# Patient Record
Sex: Male | Born: 1940 | Race: White | Hispanic: No | Marital: Married | Smoking: Never smoker
Health system: Southern US, Community
[De-identification: ages and names within clinical notes are randomized; demographics above are authoritative.]

---

## 2015-09-05 DIAGNOSIS — Z0000 Encounter for general adult medical examination without abnormal findings: Secondary | ICD-10-CM | POA: Diagnosis not present

## 2015-09-05 DIAGNOSIS — E785 Hyperlipidemia, unspecified: Secondary | ICD-10-CM | POA: Diagnosis not present

## 2015-09-05 DIAGNOSIS — Z125 Encounter for screening for malignant neoplasm of prostate: Secondary | ICD-10-CM | POA: Diagnosis not present

## 2015-09-05 DIAGNOSIS — Z139 Encounter for screening, unspecified: Secondary | ICD-10-CM | POA: Diagnosis not present

## 2015-09-05 DIAGNOSIS — Z23 Encounter for immunization: Secondary | ICD-10-CM | POA: Diagnosis not present

## 2015-09-05 DIAGNOSIS — E039 Hypothyroidism, unspecified: Secondary | ICD-10-CM | POA: Diagnosis not present

## 2015-09-05 DIAGNOSIS — Z1389 Encounter for screening for other disorder: Secondary | ICD-10-CM | POA: Diagnosis not present

## 2015-09-05 DIAGNOSIS — Z9181 History of falling: Secondary | ICD-10-CM | POA: Diagnosis not present

## 2015-09-05 DIAGNOSIS — E119 Type 2 diabetes mellitus without complications: Secondary | ICD-10-CM | POA: Diagnosis not present

## 2015-09-05 DIAGNOSIS — K5090 Crohn's disease, unspecified, without complications: Secondary | ICD-10-CM | POA: Diagnosis not present

## 2015-10-06 DIAGNOSIS — E119 Type 2 diabetes mellitus without complications: Secondary | ICD-10-CM | POA: Diagnosis not present

## 2015-10-06 DIAGNOSIS — H2513 Age-related nuclear cataract, bilateral: Secondary | ICD-10-CM | POA: Diagnosis not present

## 2016-08-02 DIAGNOSIS — Z6821 Body mass index (BMI) 21.0-21.9, adult: Secondary | ICD-10-CM | POA: Diagnosis not present

## 2016-08-02 DIAGNOSIS — J029 Acute pharyngitis, unspecified: Secondary | ICD-10-CM | POA: Diagnosis not present

## 2016-08-02 DIAGNOSIS — E039 Hypothyroidism, unspecified: Secondary | ICD-10-CM | POA: Diagnosis not present

## 2016-08-02 DIAGNOSIS — R221 Localized swelling, mass and lump, neck: Secondary | ICD-10-CM | POA: Diagnosis not present

## 2016-08-02 DIAGNOSIS — Z23 Encounter for immunization: Secondary | ICD-10-CM | POA: Diagnosis not present

## 2016-08-08 DIAGNOSIS — R221 Localized swelling, mass and lump, neck: Secondary | ICD-10-CM | POA: Diagnosis not present

## 2016-08-09 DIAGNOSIS — E079 Disorder of thyroid, unspecified: Secondary | ICD-10-CM | POA: Diagnosis not present

## 2016-08-09 DIAGNOSIS — Z6821 Body mass index (BMI) 21.0-21.9, adult: Secondary | ICD-10-CM | POA: Diagnosis not present

## 2016-08-13 DIAGNOSIS — E0789 Other specified disorders of thyroid: Secondary | ICD-10-CM | POA: Diagnosis not present

## 2016-08-16 ENCOUNTER — Other Ambulatory Visit (HOSPITAL_COMMUNITY): Payer: Self-pay | Admitting: General Surgery

## 2016-08-16 DIAGNOSIS — R221 Localized swelling, mass and lump, neck: Secondary | ICD-10-CM

## 2016-08-18 ENCOUNTER — Encounter (HOSPITAL_COMMUNITY): Payer: Self-pay

## 2016-08-18 ENCOUNTER — Emergency Department (HOSPITAL_COMMUNITY)
Admission: EM | Admit: 2016-08-18 | Discharge: 2016-08-18 | Payer: Commercial Managed Care - HMO | Attending: Emergency Medicine | Admitting: Emergency Medicine

## 2016-08-18 ENCOUNTER — Emergency Department (HOSPITAL_COMMUNITY): Payer: Commercial Managed Care - HMO

## 2016-08-18 DIAGNOSIS — C8338 Diffuse large B-cell lymphoma, lymph nodes of multiple sites: Secondary | ICD-10-CM | POA: Diagnosis not present

## 2016-08-18 DIAGNOSIS — E039 Hypothyroidism, unspecified: Secondary | ICD-10-CM | POA: Diagnosis not present

## 2016-08-18 DIAGNOSIS — E441 Mild protein-calorie malnutrition: Secondary | ICD-10-CM | POA: Diagnosis not present

## 2016-08-18 DIAGNOSIS — D638 Anemia in other chronic diseases classified elsewhere: Secondary | ICD-10-CM | POA: Diagnosis not present

## 2016-08-18 DIAGNOSIS — Z79899 Other long term (current) drug therapy: Secondary | ICD-10-CM | POA: Diagnosis not present

## 2016-08-18 DIAGNOSIS — Z9049 Acquired absence of other specified parts of digestive tract: Secondary | ICD-10-CM | POA: Diagnosis not present

## 2016-08-18 DIAGNOSIS — Z930 Tracheostomy status: Secondary | ICD-10-CM | POA: Diagnosis not present

## 2016-08-18 DIAGNOSIS — R109 Unspecified abdominal pain: Secondary | ICD-10-CM | POA: Diagnosis not present

## 2016-08-18 DIAGNOSIS — Z87898 Personal history of other specified conditions: Secondary | ICD-10-CM | POA: Diagnosis not present

## 2016-08-18 DIAGNOSIS — R061 Stridor: Secondary | ICD-10-CM | POA: Insufficient documentation

## 2016-08-18 DIAGNOSIS — Z7901 Long term (current) use of anticoagulants: Secondary | ICD-10-CM | POA: Diagnosis not present

## 2016-08-18 DIAGNOSIS — K5090 Crohn's disease, unspecified, without complications: Secondary | ICD-10-CM | POA: Diagnosis not present

## 2016-08-18 DIAGNOSIS — J984 Other disorders of lung: Secondary | ICD-10-CM | POA: Diagnosis not present

## 2016-08-18 DIAGNOSIS — I959 Hypotension, unspecified: Secondary | ICD-10-CM | POA: Diagnosis not present

## 2016-08-18 DIAGNOSIS — R140 Abdominal distension (gaseous): Secondary | ICD-10-CM | POA: Diagnosis not present

## 2016-08-18 DIAGNOSIS — K567 Ileus, unspecified: Secondary | ICD-10-CM | POA: Diagnosis not present

## 2016-08-18 DIAGNOSIS — I82890 Acute embolism and thrombosis of other specified veins: Secondary | ICD-10-CM | POA: Diagnosis not present

## 2016-08-18 DIAGNOSIS — R600 Localized edema: Secondary | ICD-10-CM | POA: Diagnosis not present

## 2016-08-18 DIAGNOSIS — I361 Nonrheumatic tricuspid (valve) insufficiency: Secondary | ICD-10-CM | POA: Diagnosis not present

## 2016-08-18 DIAGNOSIS — C8330 Diffuse large B-cell lymphoma, unspecified site: Secondary | ICD-10-CM | POA: Diagnosis not present

## 2016-08-18 DIAGNOSIS — R0602 Shortness of breath: Secondary | ICD-10-CM | POA: Diagnosis present

## 2016-08-18 DIAGNOSIS — E440 Moderate protein-calorie malnutrition: Secondary | ICD-10-CM | POA: Diagnosis not present

## 2016-08-18 DIAGNOSIS — Z4682 Encounter for fitting and adjustment of non-vascular catheter: Secondary | ICD-10-CM | POA: Diagnosis not present

## 2016-08-18 DIAGNOSIS — I82C11 Acute embolism and thrombosis of right internal jugular vein: Secondary | ICD-10-CM | POA: Diagnosis not present

## 2016-08-18 DIAGNOSIS — I808 Phlebitis and thrombophlebitis of other sites: Secondary | ICD-10-CM | POA: Diagnosis not present

## 2016-08-18 DIAGNOSIS — C8339 Diffuse large B-cell lymphoma, extranodal and solid organ sites: Secondary | ICD-10-CM | POA: Diagnosis not present

## 2016-08-18 DIAGNOSIS — D649 Anemia, unspecified: Secondary | ICD-10-CM | POA: Diagnosis not present

## 2016-08-18 DIAGNOSIS — J392 Other diseases of pharynx: Secondary | ICD-10-CM | POA: Diagnosis not present

## 2016-08-18 DIAGNOSIS — Z9981 Dependence on supplemental oxygen: Secondary | ICD-10-CM | POA: Diagnosis not present

## 2016-08-18 DIAGNOSIS — Z7682 Awaiting organ transplant status: Secondary | ICD-10-CM | POA: Diagnosis not present

## 2016-08-18 DIAGNOSIS — I82C19 Acute embolism and thrombosis of unspecified internal jugular vein: Secondary | ICD-10-CM | POA: Diagnosis not present

## 2016-08-18 DIAGNOSIS — Z4659 Encounter for fitting and adjustment of other gastrointestinal appliance and device: Secondary | ICD-10-CM | POA: Diagnosis not present

## 2016-08-18 DIAGNOSIS — K631 Perforation of intestine (nontraumatic): Secondary | ICD-10-CM | POA: Diagnosis not present

## 2016-08-18 DIAGNOSIS — E079 Disorder of thyroid, unspecified: Secondary | ICD-10-CM | POA: Diagnosis not present

## 2016-08-18 DIAGNOSIS — K668 Other specified disorders of peritoneum: Secondary | ICD-10-CM | POA: Diagnosis not present

## 2016-08-18 DIAGNOSIS — T82868A Thrombosis of vascular prosthetic devices, implants and grafts, initial encounter: Secondary | ICD-10-CM | POA: Diagnosis not present

## 2016-08-18 DIAGNOSIS — R221 Localized swelling, mass and lump, neck: Secondary | ICD-10-CM | POA: Diagnosis not present

## 2016-08-18 DIAGNOSIS — E049 Nontoxic goiter, unspecified: Secondary | ICD-10-CM | POA: Diagnosis not present

## 2016-08-18 DIAGNOSIS — Z9889 Other specified postprocedural states: Secondary | ICD-10-CM | POA: Diagnosis not present

## 2016-08-18 DIAGNOSIS — J690 Pneumonitis due to inhalation of food and vomit: Secondary | ICD-10-CM | POA: Diagnosis not present

## 2016-08-18 DIAGNOSIS — J9601 Acute respiratory failure with hypoxia: Secondary | ICD-10-CM | POA: Diagnosis not present

## 2016-08-18 DIAGNOSIS — J698 Pneumonitis due to inhalation of other solids and liquids: Secondary | ICD-10-CM | POA: Diagnosis not present

## 2016-08-18 DIAGNOSIS — B9620 Unspecified Escherichia coli [E. coli] as the cause of diseases classified elsewhere: Secondary | ICD-10-CM | POA: Diagnosis not present

## 2016-08-18 DIAGNOSIS — C8331 Diffuse large B-cell lymphoma, lymph nodes of head, face, and neck: Secondary | ICD-10-CM | POA: Diagnosis not present

## 2016-08-18 DIAGNOSIS — C7800 Secondary malignant neoplasm of unspecified lung: Secondary | ICD-10-CM | POA: Diagnosis not present

## 2016-08-18 DIAGNOSIS — K6389 Other specified diseases of intestine: Secondary | ICD-10-CM | POA: Diagnosis not present

## 2016-08-18 DIAGNOSIS — E041 Nontoxic single thyroid nodule: Secondary | ICD-10-CM | POA: Diagnosis not present

## 2016-08-18 DIAGNOSIS — Z66 Do not resuscitate: Secondary | ICD-10-CM | POA: Diagnosis not present

## 2016-08-18 DIAGNOSIS — R918 Other nonspecific abnormal finding of lung field: Secondary | ICD-10-CM | POA: Diagnosis not present

## 2016-08-18 DIAGNOSIS — C8333 Diffuse large B-cell lymphoma, intra-abdominal lymph nodes: Secondary | ICD-10-CM | POA: Diagnosis not present

## 2016-08-18 DIAGNOSIS — J398 Other specified diseases of upper respiratory tract: Secondary | ICD-10-CM | POA: Diagnosis not present

## 2016-08-18 DIAGNOSIS — J9690 Respiratory failure, unspecified, unspecified whether with hypoxia or hypercapnia: Secondary | ICD-10-CM | POA: Diagnosis not present

## 2016-08-18 DIAGNOSIS — R1310 Dysphagia, unspecified: Secondary | ICD-10-CM | POA: Diagnosis not present

## 2016-08-18 DIAGNOSIS — Z431 Encounter for attention to gastrostomy: Secondary | ICD-10-CM | POA: Diagnosis not present

## 2016-08-18 DIAGNOSIS — R1314 Dysphagia, pharyngoesophageal phase: Secondary | ICD-10-CM | POA: Diagnosis not present

## 2016-08-18 LAB — BASIC METABOLIC PANEL
Anion gap: 9 (ref 5–15)
BUN: 16 mg/dL (ref 6–20)
CALCIUM: 9.9 mg/dL (ref 8.9–10.3)
CO2: 28 mmol/L (ref 22–32)
CREATININE: 1.29 mg/dL — AB (ref 0.61–1.24)
Chloride: 98 mmol/L — ABNORMAL LOW (ref 101–111)
GFR, EST NON AFRICAN AMERICAN: 52 mL/min — AB (ref >60)
Glucose, Bld: 123 mg/dL — ABNORMAL HIGH (ref 65–99)
Potassium: 3.8 mmol/L (ref 3.5–5.1)
SODIUM: 135 mmol/L (ref 135–145)

## 2016-08-18 LAB — CBC WITH DIFFERENTIAL/PLATELET
BASOS ABS: 0 10*3/uL (ref 0.0–0.1)
BASOS PCT: 1 %
EOS ABS: 0.3 10*3/uL (ref 0.0–0.7)
Eosinophils Relative: 4 %
HCT: 38.1 % — ABNORMAL LOW (ref 39.0–52.0)
Hemoglobin: 12.8 g/dL — ABNORMAL LOW (ref 13.0–17.0)
Lymphocytes Relative: 14 %
Lymphs Abs: 1 10*3/uL (ref 0.7–4.0)
MCH: 31.4 pg (ref 26.0–34.0)
MCHC: 33.6 g/dL (ref 30.0–36.0)
MCV: 93.4 fL (ref 78.0–100.0)
MONO ABS: 0.8 10*3/uL (ref 0.1–1.0)
MONOS PCT: 10 %
NEUTROS ABS: 5.2 10*3/uL (ref 1.7–7.7)
Neutrophils Relative %: 71 %
PLATELETS: 279 10*3/uL (ref 150–400)
RBC: 4.08 MIL/uL — ABNORMAL LOW (ref 4.22–5.81)
RDW: 12.6 % (ref 11.5–15.5)
WBC: 7.3 10*3/uL (ref 4.0–10.5)

## 2016-08-18 MED ORDER — DEXAMETHASONE SODIUM PHOSPHATE 10 MG/ML IJ SOLN
10.0000 mg | Freq: Once | INTRAMUSCULAR | Status: AC
  Administered 2016-08-18: 10 mg via INTRAVENOUS
  Filled 2016-08-18: qty 1

## 2016-08-18 NOTE — ED Notes (Signed)
Report was given to Rosealee Albee, RN with [employer]  transport service

## 2016-08-18 NOTE — ED Provider Notes (Signed)
[facility] [facility] DEPT Provider Note   CSN: [id number] Arrival date & time: 08/18/16  1540     History   Chief Complaint Chief Complaint  Patient presents with  . Sore Throat  . Shortness of Breath    HPI Mario Goodwin is a 75 y.o. male.  Patient presents with worsening shortness of breath and difficulty swallowing after he was diagnosed with a large thyroid mass 1 week ago. Reports worsening vocal hoarseness. He is scheduled to get an IR-guided biopsy and see ENT at [facility] this week. Was seen by surgery here. No other symptoms.   The history is provided by the patient and medical records. No language interpreter was used.  Shortness of Breath  This is a new problem. The average episode lasts 2 days. The problem occurs continuously.The current episode started more than 2 days ago. The problem has been gradually worsening. Associated symptoms include sore throat. Pertinent negatives include no fever, no headaches, no chest pain, no vomiting and no abdominal pain. He has tried nothing for the symptoms. The treatment provided no relief. He has had prior hospitalizations. He has had prior ED visits. Associated medical issues comments: Thyroid mass.    No past medical history on file.  There are no active problems to display for this patient.   No past surgical history on file.     Home Medications    Prior to Admission medications   Medication Sig Start Date End Date Taking? Authorizing Provider  levothyroxine (SYNTHROID, LEVOTHROID) 75 MCG tablet Take 75 mcg by mouth daily before breakfast.   Yes Historical Provider, MD  sulfaDIAZINE 500 MG tablet Take 1,000 mg by mouth 2 (two) times daily.   Yes Historical Provider, MD    Family History No family history on file.  Social History Social History  Substance Use Topics  . Smoking status: Never Smoker  . Smokeless tobacco: Never Used  . Alcohol use Yes     Allergies   Review of patient's allergies indicates not on  file.   Review of Systems Review of Systems  Constitutional: Negative for fever.  HENT: Positive for sore throat, trouble swallowing and voice change.   Respiratory: Positive for shortness of breath and stridor.   Cardiovascular: Negative for chest pain.  Gastrointestinal: Negative for abdominal pain and vomiting.  Genitourinary: Negative.   Musculoskeletal: Negative.   Skin: Negative.   Allergic/Immunologic: Negative for immunocompromised state.  Neurological: Negative for headaches.  Hematological: Does not bruise/bleed easily.  Psychiatric/Behavioral: Negative.      Physical Exam Updated Vital Signs BP 139/80   Pulse 84   Temp 98.5 F (36.9 C)   Resp 14   Ht 5\' 8"  (1.727 m)   Wt 63.6 kg   SpO2 94%   BMI 21.32 kg/m   Physical Exam  Constitutional: He is oriented to person, place, and time. He appears well-developed and well-nourished. No distress.  HENT:  Head: Normocephalic and atraumatic.  Mouth/Throat: Oropharynx is clear and moist.  Eyes: Conjunctivae and EOM are normal. Pupils are equal, round, and reactive to light.  Neck: Neck supple. Thyromegaly present.  Cardiovascular: Normal rate, regular rhythm and normal heart sounds.  Exam reveals no gallop and no friction rub.   No murmur heard. Pulmonary/Chest: Effort normal. No respiratory distress. He has no wheezes. He has no rales.  Notable inspiratory stridor. Able to speak in full sentences. Full neck ROM. Lung sounds clear to auscultation.  Neurological: He is alert and oriented to person, place, and time.  Skin: Skin is warm and dry. Capillary refill takes less than 2 seconds. He is not diaphoretic.     ED Treatments / Results  Labs (all labs ordered are listed, but only abnormal results are displayed) Labs Reviewed  CBC WITH DIFFERENTIAL/PLATELET - Abnormal; Notable for the following:       Result Value   RBC 4.08 (*)    Hemoglobin 12.8 (*)    HCT 38.1 (*)    All other components within normal  limits  BASIC METABOLIC PANEL - Abnormal; Notable for the following:    Chloride 98 (*)    Glucose, Bld 123 (*)    Creatinine, Ser 1.29 (*)    GFR calc non Af Amer 52 (*)    All other components within normal limits    EKG  EKG Interpretation None       Radiology Dg Chest 2 View  Result Date: 08/18/2016 CLINICAL DATA:  Shortness of breath. Right-sided neck mass most likely thyroid in origin. EXAM: CHEST  2 VIEW COMPARISON:  Chest x-ray dated 02/11/2014 and CT scan of the neck dated 08/08/2016 FINDINGS: Patient has multiple metastatic nodules in both lungs, the largest being approximately 2.5 cm at the left lung base and 2 nodules measuring 2.8 and 2.6 cm at the right lung base. The There is compression and deviation of the trachea to the left of the trachea by the large right-sided neck mass just above the thoracic inlet. This is probably the source of the patient's shortness of breath. Heart size and pulmonary vascularity are normal. No effusions.  No discrete bone abnormality. IMPRESSION: 1. Marked compression of the trachea just above the thoracic inlet by the large right thyroid mass. 2. 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 Electronically Signed   By: Lorriane Shire M.D.   On: 08/18/2016 17:31    Procedures Procedures (including critical care time)  Medications Ordered in ED Medications  dexamethasone (DECADRON) injection 10 mg (10 mg Intravenous Given 08/18/16 1826)     Initial Impression / Assessment and Plan / ED Course  I have reviewed the triage vital signs and the nursing notes.  Pertinent labs & imaging results that were available during my care of the patient were reviewed by me and considered in my medical decision making (see chart for details).  Clinical Course    Patient presents with two days worsening difficulty breathing, hoarseness, difficulty swallowing after he was recently diagnosed with a  thyroid mass that is likely malignant but has not yet been biopsied. On arrival he has notable inspiratory stridor though can tolerate his secretions and is able to tolerate neck movement as well. Lung sounds are clear. CXR with marked airway compression consistent with known mass. He has been seen by surgery here and is scheduled to see ENT at [facility] later this week. Given this, discussed his case with on-call ENT at [facility] and he will be transferred there for evaluation and possible admission. Do not feel he needs emergent intubation though his stridor is concerning. He was given a dose of IV decadron and was transferred to [facility] by ambulance in stable condition. On reassessment 30 minutes prior to transfer, he continued to be able to maintain his airway and reported slight improvement in symptoms after getting decadron.  Final Clinical Impressions(s) / ED Diagnoses   Final diagnoses:  Thyroid mass  Inspiratory stridor    New Prescriptions Discharge Medication List as of 08/18/2016  8:01 PM  Harlin Heys, MD 08/18/16 [facility]eding    Leonard Schwartz, MD 08/31/16 2204

## 2016-08-18 NOTE — ED Triage Notes (Signed)
Pt complaining of SOB. Pt states growth next to trachea, going to carotid artery. Pt states increasing SOB x 1 week. Pt with some hoarseness in voice. Pt states difficulty swallowing. Pt ambulatory at triage, O2 sats = 97.

## 2016-08-18 NOTE — ED Notes (Signed)
Ambulated pt to the bathroom and back.  Tolerated well.

## 2016-08-21 ENCOUNTER — Other Ambulatory Visit: Payer: Self-pay | Admitting: Radiology

## 2016-08-22 ENCOUNTER — Ambulatory Visit (HOSPITAL_COMMUNITY): Admission: RE | Admit: 2016-08-22 | Payer: Commercial Managed Care - HMO | Source: Ambulatory Visit

## 2016-09-07 DIAGNOSIS — E079 Disorder of thyroid, unspecified: Secondary | ICD-10-CM | POA: Diagnosis not present

## 2016-09-07 DIAGNOSIS — Z48815 Encounter for surgical aftercare following surgery on the digestive system: Secondary | ICD-10-CM | POA: Diagnosis not present

## 2016-09-07 DIAGNOSIS — Z430 Encounter for attention to tracheostomy: Secondary | ICD-10-CM | POA: Diagnosis not present

## 2016-09-07 DIAGNOSIS — L89322 Pressure ulcer of left buttock, stage 2: Secondary | ICD-10-CM | POA: Diagnosis not present

## 2016-09-07 DIAGNOSIS — C7800 Secondary malignant neoplasm of unspecified lung: Secondary | ICD-10-CM | POA: Diagnosis not present

## 2016-09-07 DIAGNOSIS — C8331 Diffuse large B-cell lymphoma, lymph nodes of head, face, and neck: Secondary | ICD-10-CM | POA: Diagnosis not present

## 2016-09-08 DIAGNOSIS — C8331 Diffuse large B-cell lymphoma, lymph nodes of head, face, and neck: Secondary | ICD-10-CM | POA: Diagnosis not present

## 2016-09-08 DIAGNOSIS — Z48815 Encounter for surgical aftercare following surgery on the digestive system: Secondary | ICD-10-CM | POA: Diagnosis not present

## 2016-09-08 DIAGNOSIS — C7800 Secondary malignant neoplasm of unspecified lung: Secondary | ICD-10-CM | POA: Diagnosis not present

## 2016-09-08 DIAGNOSIS — E079 Disorder of thyroid, unspecified: Secondary | ICD-10-CM | POA: Diagnosis not present

## 2016-09-08 DIAGNOSIS — L89322 Pressure ulcer of left buttock, stage 2: Secondary | ICD-10-CM | POA: Diagnosis not present

## 2016-09-08 DIAGNOSIS — Z430 Encounter for attention to tracheostomy: Secondary | ICD-10-CM | POA: Diagnosis not present

## 2016-09-09 DIAGNOSIS — Z48815 Encounter for surgical aftercare following surgery on the digestive system: Secondary | ICD-10-CM | POA: Diagnosis not present

## 2016-09-09 DIAGNOSIS — C8331 Diffuse large B-cell lymphoma, lymph nodes of head, face, and neck: Secondary | ICD-10-CM | POA: Diagnosis not present

## 2016-09-09 DIAGNOSIS — E079 Disorder of thyroid, unspecified: Secondary | ICD-10-CM | POA: Diagnosis not present

## 2016-09-09 DIAGNOSIS — C8338 Diffuse large B-cell lymphoma, lymph nodes of multiple sites: Secondary | ICD-10-CM | POA: Diagnosis not present

## 2016-09-09 DIAGNOSIS — L89322 Pressure ulcer of left buttock, stage 2: Secondary | ICD-10-CM | POA: Diagnosis not present

## 2016-09-09 DIAGNOSIS — C7800 Secondary malignant neoplasm of unspecified lung: Secondary | ICD-10-CM | POA: Diagnosis not present

## 2016-09-09 DIAGNOSIS — Z430 Encounter for attention to tracheostomy: Secondary | ICD-10-CM | POA: Diagnosis not present

## 2016-09-10 DIAGNOSIS — T85598A Other mechanical complication of other gastrointestinal prosthetic devices, implants and grafts, initial encounter: Secondary | ICD-10-CM | POA: Diagnosis not present

## 2016-09-10 DIAGNOSIS — C7800 Secondary malignant neoplasm of unspecified lung: Secondary | ICD-10-CM | POA: Diagnosis not present

## 2016-09-10 DIAGNOSIS — L89322 Pressure ulcer of left buttock, stage 2: Secondary | ICD-10-CM | POA: Diagnosis not present

## 2016-09-10 DIAGNOSIS — C8331 Diffuse large B-cell lymphoma, lymph nodes of head, face, and neck: Secondary | ICD-10-CM | POA: Diagnosis not present

## 2016-09-10 DIAGNOSIS — Z48815 Encounter for surgical aftercare following surgery on the digestive system: Secondary | ICD-10-CM | POA: Diagnosis not present

## 2016-09-10 DIAGNOSIS — Z430 Encounter for attention to tracheostomy: Secondary | ICD-10-CM | POA: Diagnosis not present

## 2016-09-10 DIAGNOSIS — K9423 Gastrostomy malfunction: Secondary | ICD-10-CM | POA: Diagnosis not present

## 2016-09-10 DIAGNOSIS — E079 Disorder of thyroid, unspecified: Secondary | ICD-10-CM | POA: Diagnosis not present

## 2016-09-11 DIAGNOSIS — C8331 Diffuse large B-cell lymphoma, lymph nodes of head, face, and neck: Secondary | ICD-10-CM | POA: Diagnosis not present

## 2016-09-11 DIAGNOSIS — E079 Disorder of thyroid, unspecified: Secondary | ICD-10-CM | POA: Diagnosis not present

## 2016-09-11 DIAGNOSIS — C7800 Secondary malignant neoplasm of unspecified lung: Secondary | ICD-10-CM | POA: Diagnosis not present

## 2016-09-11 DIAGNOSIS — Z438 Encounter for attention to other artificial openings: Secondary | ICD-10-CM | POA: Diagnosis not present

## 2016-09-11 DIAGNOSIS — Z430 Encounter for attention to tracheostomy: Secondary | ICD-10-CM | POA: Diagnosis not present

## 2016-09-11 DIAGNOSIS — L89322 Pressure ulcer of left buttock, stage 2: Secondary | ICD-10-CM | POA: Diagnosis not present

## 2016-09-11 DIAGNOSIS — K9423 Gastrostomy malfunction: Secondary | ICD-10-CM | POA: Diagnosis not present

## 2016-09-11 DIAGNOSIS — Z48815 Encounter for surgical aftercare following surgery on the digestive system: Secondary | ICD-10-CM | POA: Diagnosis not present

## 2016-09-13 DIAGNOSIS — C7800 Secondary malignant neoplasm of unspecified lung: Secondary | ICD-10-CM | POA: Diagnosis not present

## 2016-09-13 DIAGNOSIS — E079 Disorder of thyroid, unspecified: Secondary | ICD-10-CM | POA: Diagnosis not present

## 2016-09-13 DIAGNOSIS — C8331 Diffuse large B-cell lymphoma, lymph nodes of head, face, and neck: Secondary | ICD-10-CM | POA: Diagnosis not present

## 2016-09-13 DIAGNOSIS — L89322 Pressure ulcer of left buttock, stage 2: Secondary | ICD-10-CM | POA: Diagnosis not present

## 2016-09-13 DIAGNOSIS — Z430 Encounter for attention to tracheostomy: Secondary | ICD-10-CM | POA: Diagnosis not present

## 2016-09-13 DIAGNOSIS — Z48815 Encounter for surgical aftercare following surgery on the digestive system: Secondary | ICD-10-CM | POA: Diagnosis not present

## 2016-09-14 DIAGNOSIS — T85598A Other mechanical complication of other gastrointestinal prosthetic devices, implants and grafts, initial encounter: Secondary | ICD-10-CM | POA: Diagnosis not present

## 2016-09-14 DIAGNOSIS — K9429 Other complications of gastrostomy: Secondary | ICD-10-CM | POA: Diagnosis not present

## 2016-09-16 DIAGNOSIS — L89322 Pressure ulcer of left buttock, stage 2: Secondary | ICD-10-CM | POA: Diagnosis not present

## 2016-09-16 DIAGNOSIS — T85598A Other mechanical complication of other gastrointestinal prosthetic devices, implants and grafts, initial encounter: Secondary | ICD-10-CM | POA: Diagnosis not present

## 2016-09-16 DIAGNOSIS — C8331 Diffuse large B-cell lymphoma, lymph nodes of head, face, and neck: Secondary | ICD-10-CM | POA: Diagnosis not present

## 2016-09-16 DIAGNOSIS — C7800 Secondary malignant neoplasm of unspecified lung: Secondary | ICD-10-CM | POA: Diagnosis not present

## 2016-09-16 DIAGNOSIS — Z48815 Encounter for surgical aftercare following surgery on the digestive system: Secondary | ICD-10-CM | POA: Diagnosis not present

## 2016-09-16 DIAGNOSIS — E039 Hypothyroidism, unspecified: Secondary | ICD-10-CM | POA: Diagnosis not present

## 2016-09-16 DIAGNOSIS — R221 Localized swelling, mass and lump, neck: Secondary | ICD-10-CM | POA: Diagnosis not present

## 2016-09-16 DIAGNOSIS — K9423 Gastrostomy malfunction: Secondary | ICD-10-CM | POA: Diagnosis not present

## 2016-09-16 DIAGNOSIS — Z4682 Encounter for fitting and adjustment of non-vascular catheter: Secondary | ICD-10-CM | POA: Diagnosis not present

## 2016-09-16 DIAGNOSIS — Z859 Personal history of malignant neoplasm, unspecified: Secondary | ICD-10-CM | POA: Diagnosis not present

## 2016-09-16 DIAGNOSIS — Z430 Encounter for attention to tracheostomy: Secondary | ICD-10-CM | POA: Diagnosis not present

## 2016-09-16 DIAGNOSIS — E079 Disorder of thyroid, unspecified: Secondary | ICD-10-CM | POA: Diagnosis not present

## 2016-09-16 DIAGNOSIS — Z5112 Encounter for antineoplastic immunotherapy: Secondary | ICD-10-CM | POA: Diagnosis not present

## 2016-09-17 DIAGNOSIS — E079 Disorder of thyroid, unspecified: Secondary | ICD-10-CM | POA: Diagnosis not present

## 2016-09-17 DIAGNOSIS — C7800 Secondary malignant neoplasm of unspecified lung: Secondary | ICD-10-CM | POA: Diagnosis not present

## 2016-09-17 DIAGNOSIS — C8331 Diffuse large B-cell lymphoma, lymph nodes of head, face, and neck: Secondary | ICD-10-CM | POA: Diagnosis not present

## 2016-09-17 DIAGNOSIS — L89322 Pressure ulcer of left buttock, stage 2: Secondary | ICD-10-CM | POA: Diagnosis not present

## 2016-09-17 DIAGNOSIS — Z79899 Other long term (current) drug therapy: Secondary | ICD-10-CM | POA: Diagnosis not present

## 2016-09-17 DIAGNOSIS — E039 Hypothyroidism, unspecified: Secondary | ICD-10-CM | POA: Diagnosis not present

## 2016-09-17 DIAGNOSIS — Z430 Encounter for attention to tracheostomy: Secondary | ICD-10-CM | POA: Diagnosis not present

## 2016-09-17 DIAGNOSIS — Z48815 Encounter for surgical aftercare following surgery on the digestive system: Secondary | ICD-10-CM | POA: Diagnosis not present

## 2016-09-17 DIAGNOSIS — C8338 Diffuse large B-cell lymphoma, lymph nodes of multiple sites: Secondary | ICD-10-CM | POA: Diagnosis not present

## 2016-09-18 DIAGNOSIS — Z430 Encounter for attention to tracheostomy: Secondary | ICD-10-CM | POA: Diagnosis not present

## 2016-09-18 DIAGNOSIS — E079 Disorder of thyroid, unspecified: Secondary | ICD-10-CM | POA: Diagnosis not present

## 2016-09-18 DIAGNOSIS — C8331 Diffuse large B-cell lymphoma, lymph nodes of head, face, and neck: Secondary | ICD-10-CM | POA: Diagnosis not present

## 2016-09-18 DIAGNOSIS — L89322 Pressure ulcer of left buttock, stage 2: Secondary | ICD-10-CM | POA: Diagnosis not present

## 2016-09-18 DIAGNOSIS — C7800 Secondary malignant neoplasm of unspecified lung: Secondary | ICD-10-CM | POA: Diagnosis not present

## 2016-09-18 DIAGNOSIS — Z48815 Encounter for surgical aftercare following surgery on the digestive system: Secondary | ICD-10-CM | POA: Diagnosis not present

## 2016-09-19 DIAGNOSIS — C7800 Secondary malignant neoplasm of unspecified lung: Secondary | ICD-10-CM | POA: Diagnosis not present

## 2016-09-19 DIAGNOSIS — C8331 Diffuse large B-cell lymphoma, lymph nodes of head, face, and neck: Secondary | ICD-10-CM | POA: Diagnosis not present

## 2016-09-19 DIAGNOSIS — E079 Disorder of thyroid, unspecified: Secondary | ICD-10-CM | POA: Diagnosis not present

## 2016-09-19 DIAGNOSIS — L89322 Pressure ulcer of left buttock, stage 2: Secondary | ICD-10-CM | POA: Diagnosis not present

## 2016-09-19 DIAGNOSIS — Z430 Encounter for attention to tracheostomy: Secondary | ICD-10-CM | POA: Diagnosis not present

## 2016-09-19 DIAGNOSIS — Z48815 Encounter for surgical aftercare following surgery on the digestive system: Secondary | ICD-10-CM | POA: Diagnosis not present

## 2016-09-20 DIAGNOSIS — C7800 Secondary malignant neoplasm of unspecified lung: Secondary | ICD-10-CM | POA: Diagnosis not present

## 2016-09-20 DIAGNOSIS — Z48815 Encounter for surgical aftercare following surgery on the digestive system: Secondary | ICD-10-CM | POA: Diagnosis not present

## 2016-09-20 DIAGNOSIS — C8331 Diffuse large B-cell lymphoma, lymph nodes of head, face, and neck: Secondary | ICD-10-CM | POA: Diagnosis not present

## 2016-09-20 DIAGNOSIS — L89322 Pressure ulcer of left buttock, stage 2: Secondary | ICD-10-CM | POA: Diagnosis not present

## 2016-09-20 DIAGNOSIS — Z430 Encounter for attention to tracheostomy: Secondary | ICD-10-CM | POA: Diagnosis not present

## 2016-09-20 DIAGNOSIS — E079 Disorder of thyroid, unspecified: Secondary | ICD-10-CM | POA: Diagnosis not present

## 2016-09-22 DIAGNOSIS — K9423 Gastrostomy malfunction: Secondary | ICD-10-CM | POA: Diagnosis not present

## 2016-09-22 DIAGNOSIS — J690 Pneumonitis due to inhalation of food and vomit: Secondary | ICD-10-CM | POA: Diagnosis not present

## 2016-09-22 DIAGNOSIS — R05 Cough: Secondary | ICD-10-CM | POA: Diagnosis not present

## 2016-09-22 DIAGNOSIS — N200 Calculus of kidney: Secondary | ICD-10-CM | POA: Diagnosis not present

## 2016-09-22 DIAGNOSIS — R918 Other nonspecific abnormal finding of lung field: Secondary | ICD-10-CM | POA: Diagnosis not present

## 2016-09-22 DIAGNOSIS — Z4682 Encounter for fitting and adjustment of non-vascular catheter: Secondary | ICD-10-CM | POA: Diagnosis not present

## 2016-09-22 DIAGNOSIS — R069 Unspecified abnormalities of breathing: Secondary | ICD-10-CM | POA: Diagnosis not present

## 2016-09-22 DIAGNOSIS — C329 Malignant neoplasm of larynx, unspecified: Secondary | ICD-10-CM | POA: Diagnosis not present

## 2016-09-23 DIAGNOSIS — R278 Other lack of coordination: Secondary | ICD-10-CM | POA: Diagnosis not present

## 2016-09-23 DIAGNOSIS — J690 Pneumonitis due to inhalation of food and vomit: Secondary | ICD-10-CM | POA: Diagnosis not present

## 2016-09-23 DIAGNOSIS — D6481 Anemia due to antineoplastic chemotherapy: Secondary | ICD-10-CM | POA: Diagnosis not present

## 2016-09-23 DIAGNOSIS — M6281 Muscle weakness (generalized): Secondary | ICD-10-CM | POA: Diagnosis not present

## 2016-09-23 DIAGNOSIS — C329 Malignant neoplasm of larynx, unspecified: Secondary | ICD-10-CM | POA: Diagnosis not present

## 2016-09-23 DIAGNOSIS — Z4682 Encounter for fitting and adjustment of non-vascular catheter: Secondary | ICD-10-CM | POA: Diagnosis not present

## 2016-09-23 DIAGNOSIS — E079 Disorder of thyroid, unspecified: Secondary | ICD-10-CM | POA: Diagnosis not present

## 2016-09-23 DIAGNOSIS — E43 Unspecified severe protein-calorie malnutrition: Secondary | ICD-10-CM | POA: Diagnosis not present

## 2016-09-23 DIAGNOSIS — R634 Abnormal weight loss: Secondary | ICD-10-CM | POA: Diagnosis not present

## 2016-09-23 DIAGNOSIS — J9601 Acute respiratory failure with hypoxia: Secondary | ICD-10-CM | POA: Diagnosis not present

## 2016-09-23 DIAGNOSIS — K9423 Gastrostomy malfunction: Secondary | ICD-10-CM | POA: Diagnosis not present

## 2016-09-23 DIAGNOSIS — R069 Unspecified abnormalities of breathing: Secondary | ICD-10-CM | POA: Diagnosis not present

## 2016-09-23 DIAGNOSIS — Z431 Encounter for attention to gastrostomy: Secondary | ICD-10-CM | POA: Diagnosis not present

## 2016-09-23 DIAGNOSIS — Z5189 Encounter for other specified aftercare: Secondary | ICD-10-CM | POA: Diagnosis not present

## 2016-09-23 DIAGNOSIS — G4733 Obstructive sleep apnea (adult) (pediatric): Secondary | ICD-10-CM | POA: Diagnosis not present

## 2016-09-23 DIAGNOSIS — E039 Hypothyroidism, unspecified: Secondary | ICD-10-CM | POA: Diagnosis not present

## 2016-09-23 DIAGNOSIS — Z930 Tracheostomy status: Secondary | ICD-10-CM | POA: Diagnosis not present

## 2016-09-23 DIAGNOSIS — K5090 Crohn's disease, unspecified, without complications: Secondary | ICD-10-CM | POA: Diagnosis not present

## 2016-09-23 DIAGNOSIS — J988 Other specified respiratory disorders: Secondary | ICD-10-CM | POA: Diagnosis not present

## 2016-09-23 DIAGNOSIS — L89302 Pressure ulcer of unspecified buttock, stage 2: Secondary | ICD-10-CM | POA: Diagnosis not present

## 2016-09-23 DIAGNOSIS — R627 Adult failure to thrive: Secondary | ICD-10-CM | POA: Diagnosis not present

## 2016-09-23 DIAGNOSIS — R633 Feeding difficulties: Secondary | ICD-10-CM | POA: Diagnosis not present

## 2016-09-23 DIAGNOSIS — C8331 Diffuse large B-cell lymphoma, lymph nodes of head, face, and neck: Secondary | ICD-10-CM | POA: Diagnosis not present

## 2016-09-23 DIAGNOSIS — R2681 Unsteadiness on feet: Secondary | ICD-10-CM | POA: Diagnosis not present

## 2016-10-01 DIAGNOSIS — Z5189 Encounter for other specified aftercare: Secondary | ICD-10-CM | POA: Diagnosis not present

## 2016-10-01 DIAGNOSIS — Z79899 Other long term (current) drug therapy: Secondary | ICD-10-CM | POA: Diagnosis not present

## 2016-10-01 DIAGNOSIS — R5383 Other fatigue: Secondary | ICD-10-CM | POA: Diagnosis not present

## 2016-10-01 DIAGNOSIS — R42 Dizziness and giddiness: Secondary | ICD-10-CM | POA: Diagnosis not present

## 2016-10-01 DIAGNOSIS — Z5112 Encounter for antineoplastic immunotherapy: Secondary | ICD-10-CM | POA: Diagnosis not present

## 2016-10-01 DIAGNOSIS — R109 Unspecified abdominal pain: Secondary | ICD-10-CM | POA: Diagnosis not present

## 2016-10-01 DIAGNOSIS — M6281 Muscle weakness (generalized): Secondary | ICD-10-CM | POA: Diagnosis not present

## 2016-10-01 DIAGNOSIS — R197 Diarrhea, unspecified: Secondary | ICD-10-CM | POA: Diagnosis not present

## 2016-10-01 DIAGNOSIS — L89302 Pressure ulcer of unspecified buttock, stage 2: Secondary | ICD-10-CM | POA: Diagnosis not present

## 2016-10-01 DIAGNOSIS — E079 Disorder of thyroid, unspecified: Secondary | ICD-10-CM | POA: Diagnosis not present

## 2016-10-01 DIAGNOSIS — C8331 Diffuse large B-cell lymphoma, lymph nodes of head, face, and neck: Secondary | ICD-10-CM | POA: Diagnosis not present

## 2016-10-01 DIAGNOSIS — K50919 Crohn's disease, unspecified, with unspecified complications: Secondary | ICD-10-CM | POA: Diagnosis not present

## 2016-10-01 DIAGNOSIS — R278 Other lack of coordination: Secondary | ICD-10-CM | POA: Diagnosis not present

## 2016-10-01 DIAGNOSIS — R918 Other nonspecific abnormal finding of lung field: Secondary | ICD-10-CM | POA: Diagnosis not present

## 2016-10-01 DIAGNOSIS — R2681 Unsteadiness on feet: Secondary | ICD-10-CM | POA: Diagnosis not present

## 2016-10-01 DIAGNOSIS — K631 Perforation of intestine (nontraumatic): Secondary | ICD-10-CM | POA: Diagnosis not present

## 2016-10-01 DIAGNOSIS — R140 Abdominal distension (gaseous): Secondary | ICD-10-CM | POA: Diagnosis not present

## 2016-10-01 DIAGNOSIS — R112 Nausea with vomiting, unspecified: Secondary | ICD-10-CM | POA: Diagnosis not present

## 2016-10-01 DIAGNOSIS — Z431 Encounter for attention to gastrostomy: Secondary | ICD-10-CM | POA: Diagnosis not present

## 2016-10-01 DIAGNOSIS — J690 Pneumonitis due to inhalation of food and vomit: Secondary | ICD-10-CM | POA: Diagnosis not present

## 2016-10-01 DIAGNOSIS — E039 Hypothyroidism, unspecified: Secondary | ICD-10-CM | POA: Diagnosis not present

## 2016-10-01 DIAGNOSIS — Z9049 Acquired absence of other specified parts of digestive tract: Secondary | ICD-10-CM | POA: Diagnosis not present

## 2016-10-01 DIAGNOSIS — K5090 Crohn's disease, unspecified, without complications: Secondary | ICD-10-CM | POA: Diagnosis not present

## 2016-10-01 DIAGNOSIS — Z930 Tracheostomy status: Secondary | ICD-10-CM | POA: Diagnosis not present

## 2016-10-01 DIAGNOSIS — J398 Other specified diseases of upper respiratory tract: Secondary | ICD-10-CM | POA: Diagnosis not present

## 2016-10-01 DIAGNOSIS — E049 Nontoxic goiter, unspecified: Secondary | ICD-10-CM | POA: Diagnosis not present

## 2016-10-01 DIAGNOSIS — E43 Unspecified severe protein-calorie malnutrition: Secondary | ICD-10-CM | POA: Diagnosis not present

## 2016-10-01 DIAGNOSIS — Z978 Presence of other specified devices: Secondary | ICD-10-CM | POA: Diagnosis not present

## 2016-10-01 DIAGNOSIS — R590 Localized enlarged lymph nodes: Secondary | ICD-10-CM | POA: Diagnosis not present

## 2016-10-01 DIAGNOSIS — C8510 Unspecified B-cell lymphoma, unspecified site: Secondary | ICD-10-CM | POA: Diagnosis not present

## 2016-10-04 DIAGNOSIS — Z978 Presence of other specified devices: Secondary | ICD-10-CM | POA: Diagnosis not present

## 2016-10-04 DIAGNOSIS — K50919 Crohn's disease, unspecified, with unspecified complications: Secondary | ICD-10-CM | POA: Diagnosis not present

## 2016-10-04 DIAGNOSIS — J690 Pneumonitis due to inhalation of food and vomit: Secondary | ICD-10-CM | POA: Diagnosis not present

## 2016-10-04 DIAGNOSIS — Z79899 Other long term (current) drug therapy: Secondary | ICD-10-CM | POA: Diagnosis not present

## 2016-10-04 DIAGNOSIS — C8510 Unspecified B-cell lymphoma, unspecified site: Secondary | ICD-10-CM | POA: Diagnosis not present

## 2016-10-07 DIAGNOSIS — C8331 Diffuse large B-cell lymphoma, lymph nodes of head, face, and neck: Secondary | ICD-10-CM | POA: Diagnosis not present

## 2016-10-07 DIAGNOSIS — Z5112 Encounter for antineoplastic immunotherapy: Secondary | ICD-10-CM | POA: Diagnosis not present

## 2016-10-08 DIAGNOSIS — E039 Hypothyroidism, unspecified: Secondary | ICD-10-CM | POA: Diagnosis not present

## 2016-10-08 DIAGNOSIS — R140 Abdominal distension (gaseous): Secondary | ICD-10-CM | POA: Diagnosis not present

## 2016-10-08 DIAGNOSIS — R112 Nausea with vomiting, unspecified: Secondary | ICD-10-CM | POA: Diagnosis not present

## 2016-10-08 DIAGNOSIS — C8331 Diffuse large B-cell lymphoma, lymph nodes of head, face, and neck: Secondary | ICD-10-CM | POA: Diagnosis not present

## 2016-10-08 DIAGNOSIS — R5383 Other fatigue: Secondary | ICD-10-CM | POA: Diagnosis not present

## 2016-10-08 DIAGNOSIS — Z9049 Acquired absence of other specified parts of digestive tract: Secondary | ICD-10-CM | POA: Diagnosis not present

## 2016-10-08 DIAGNOSIS — R002 Palpitations: Secondary | ICD-10-CM | POA: Diagnosis not present

## 2016-10-08 DIAGNOSIS — R918 Other nonspecific abnormal finding of lung field: Secondary | ICD-10-CM | POA: Diagnosis not present

## 2016-10-08 DIAGNOSIS — K5090 Crohn's disease, unspecified, without complications: Secondary | ICD-10-CM | POA: Diagnosis not present

## 2016-10-08 DIAGNOSIS — Z79899 Other long term (current) drug therapy: Secondary | ICD-10-CM | POA: Diagnosis not present

## 2016-10-08 DIAGNOSIS — R590 Localized enlarged lymph nodes: Secondary | ICD-10-CM | POA: Diagnosis not present

## 2016-10-08 DIAGNOSIS — R42 Dizziness and giddiness: Secondary | ICD-10-CM | POA: Diagnosis not present

## 2016-10-08 DIAGNOSIS — R197 Diarrhea, unspecified: Secondary | ICD-10-CM | POA: Diagnosis not present

## 2016-10-09 DIAGNOSIS — R278 Other lack of coordination: Secondary | ICD-10-CM | POA: Diagnosis not present

## 2016-10-09 DIAGNOSIS — Z431 Encounter for attention to gastrostomy: Secondary | ICD-10-CM | POA: Diagnosis not present

## 2016-10-09 DIAGNOSIS — R590 Localized enlarged lymph nodes: Secondary | ICD-10-CM | POA: Diagnosis not present

## 2016-10-09 DIAGNOSIS — R2681 Unsteadiness on feet: Secondary | ICD-10-CM | POA: Diagnosis not present

## 2016-10-09 DIAGNOSIS — L89302 Pressure ulcer of unspecified buttock, stage 2: Secondary | ICD-10-CM | POA: Diagnosis not present

## 2016-10-09 DIAGNOSIS — K209 Esophagitis, unspecified: Secondary | ICD-10-CM | POA: Diagnosis not present

## 2016-10-09 DIAGNOSIS — Z79899 Other long term (current) drug therapy: Secondary | ICD-10-CM | POA: Diagnosis not present

## 2016-10-09 DIAGNOSIS — R5383 Other fatigue: Secondary | ICD-10-CM | POA: Diagnosis not present

## 2016-10-09 DIAGNOSIS — B3781 Candidal esophagitis: Secondary | ICD-10-CM | POA: Diagnosis not present

## 2016-10-09 DIAGNOSIS — Z5111 Encounter for antineoplastic chemotherapy: Secondary | ICD-10-CM | POA: Diagnosis not present

## 2016-10-09 DIAGNOSIS — K269 Duodenal ulcer, unspecified as acute or chronic, without hemorrhage or perforation: Secondary | ICD-10-CM | POA: Diagnosis not present

## 2016-10-09 DIAGNOSIS — K922 Gastrointestinal hemorrhage, unspecified: Secondary | ICD-10-CM | POA: Diagnosis not present

## 2016-10-09 DIAGNOSIS — K6389 Other specified diseases of intestine: Secondary | ICD-10-CM | POA: Diagnosis not present

## 2016-10-09 DIAGNOSIS — C8338 Diffuse large B-cell lymphoma, lymph nodes of multiple sites: Secondary | ICD-10-CM | POA: Diagnosis not present

## 2016-10-09 DIAGNOSIS — J690 Pneumonitis due to inhalation of food and vomit: Secondary | ICD-10-CM | POA: Diagnosis not present

## 2016-10-09 DIAGNOSIS — K5090 Crohn's disease, unspecified, without complications: Secondary | ICD-10-CM | POA: Diagnosis not present

## 2016-10-09 DIAGNOSIS — Z5189 Encounter for other specified aftercare: Secondary | ICD-10-CM | POA: Diagnosis not present

## 2016-10-09 DIAGNOSIS — A4151 Sepsis due to Escherichia coli [E. coli]: Secondary | ICD-10-CM | POA: Diagnosis not present

## 2016-10-09 DIAGNOSIS — R002 Palpitations: Secondary | ICD-10-CM | POA: Diagnosis not present

## 2016-10-09 DIAGNOSIS — M6281 Muscle weakness (generalized): Secondary | ICD-10-CM | POA: Diagnosis not present

## 2016-10-09 DIAGNOSIS — D61810 Antineoplastic chemotherapy induced pancytopenia: Secondary | ICD-10-CM | POA: Diagnosis not present

## 2016-10-09 DIAGNOSIS — K2970 Gastritis, unspecified, without bleeding: Secondary | ICD-10-CM | POA: Diagnosis not present

## 2016-10-09 DIAGNOSIS — D649 Anemia, unspecified: Secondary | ICD-10-CM | POA: Diagnosis not present

## 2016-10-09 DIAGNOSIS — B370 Candidal stomatitis: Secondary | ICD-10-CM | POA: Diagnosis not present

## 2016-10-09 DIAGNOSIS — D582 Other hemoglobinopathies: Secondary | ICD-10-CM | POA: Diagnosis not present

## 2016-10-09 DIAGNOSIS — E039 Hypothyroidism, unspecified: Secondary | ICD-10-CM | POA: Diagnosis not present

## 2016-10-09 DIAGNOSIS — C8331 Diffuse large B-cell lymphoma, lymph nodes of head, face, and neck: Secondary | ICD-10-CM | POA: Diagnosis not present

## 2016-10-09 DIAGNOSIS — R935 Abnormal findings on diagnostic imaging of other abdominal regions, including retroperitoneum: Secondary | ICD-10-CM | POA: Diagnosis not present

## 2016-10-09 DIAGNOSIS — E43 Unspecified severe protein-calorie malnutrition: Secondary | ICD-10-CM | POA: Diagnosis not present

## 2016-10-09 DIAGNOSIS — Z9049 Acquired absence of other specified parts of digestive tract: Secondary | ICD-10-CM | POA: Diagnosis not present

## 2016-10-09 DIAGNOSIS — K921 Melena: Secondary | ICD-10-CM | POA: Diagnosis not present

## 2016-10-09 DIAGNOSIS — K266 Chronic or unspecified duodenal ulcer with both hemorrhage and perforation: Secondary | ICD-10-CM | POA: Diagnosis not present

## 2016-10-09 DIAGNOSIS — R112 Nausea with vomiting, unspecified: Secondary | ICD-10-CM | POA: Diagnosis not present

## 2016-10-09 DIAGNOSIS — Z930 Tracheostomy status: Secondary | ICD-10-CM | POA: Diagnosis not present

## 2016-10-09 DIAGNOSIS — R911 Solitary pulmonary nodule: Secondary | ICD-10-CM | POA: Diagnosis not present

## 2016-10-09 DIAGNOSIS — L89152 Pressure ulcer of sacral region, stage 2: Secondary | ICD-10-CM | POA: Diagnosis not present

## 2016-10-09 DIAGNOSIS — E079 Disorder of thyroid, unspecified: Secondary | ICD-10-CM | POA: Diagnosis not present

## 2016-10-09 DIAGNOSIS — R197 Diarrhea, unspecified: Secondary | ICD-10-CM | POA: Diagnosis not present

## 2016-10-11 DIAGNOSIS — Z79899 Other long term (current) drug therapy: Secondary | ICD-10-CM | POA: Diagnosis not present

## 2016-10-15 DIAGNOSIS — Z79899 Other long term (current) drug therapy: Secondary | ICD-10-CM | POA: Diagnosis not present

## 2016-10-16 DIAGNOSIS — L539 Erythematous condition, unspecified: Secondary | ICD-10-CM | POA: Diagnosis not present

## 2016-10-16 DIAGNOSIS — R9431 Abnormal electrocardiogram [ECG] [EKG]: Secondary | ICD-10-CM | POA: Diagnosis not present

## 2016-10-16 DIAGNOSIS — B3781 Candidal esophagitis: Secondary | ICD-10-CM | POA: Diagnosis not present

## 2016-10-16 DIAGNOSIS — L89152 Pressure ulcer of sacral region, stage 2: Secondary | ICD-10-CM | POA: Diagnosis not present

## 2016-10-16 DIAGNOSIS — R1013 Epigastric pain: Secondary | ICD-10-CM | POA: Diagnosis not present

## 2016-10-16 DIAGNOSIS — C801 Malignant (primary) neoplasm, unspecified: Secondary | ICD-10-CM | POA: Diagnosis not present

## 2016-10-16 DIAGNOSIS — R5081 Fever presenting with conditions classified elsewhere: Secondary | ICD-10-CM | POA: Diagnosis not present

## 2016-10-16 DIAGNOSIS — Z7409 Other reduced mobility: Secondary | ICD-10-CM | POA: Diagnosis not present

## 2016-10-16 DIAGNOSIS — R1084 Generalized abdominal pain: Secondary | ICD-10-CM | POA: Diagnosis not present

## 2016-10-16 DIAGNOSIS — J810 Acute pulmonary edema: Secondary | ICD-10-CM | POA: Diagnosis not present

## 2016-10-16 DIAGNOSIS — D709 Neutropenia, unspecified: Secondary | ICD-10-CM | POA: Diagnosis not present

## 2016-10-16 DIAGNOSIS — D61810 Antineoplastic chemotherapy induced pancytopenia: Secondary | ICD-10-CM | POA: Diagnosis not present

## 2016-10-16 DIAGNOSIS — E049 Nontoxic goiter, unspecified: Secondary | ICD-10-CM | POA: Diagnosis not present

## 2016-10-16 DIAGNOSIS — C8330 Diffuse large B-cell lymphoma, unspecified site: Secondary | ICD-10-CM | POA: Diagnosis not present

## 2016-10-16 DIAGNOSIS — E079 Disorder of thyroid, unspecified: Secondary | ICD-10-CM | POA: Diagnosis not present

## 2016-10-16 DIAGNOSIS — R1310 Dysphagia, unspecified: Secondary | ICD-10-CM | POA: Diagnosis not present

## 2016-10-16 DIAGNOSIS — B370 Candidal stomatitis: Secondary | ICD-10-CM | POA: Diagnosis not present

## 2016-10-16 DIAGNOSIS — R918 Other nonspecific abnormal finding of lung field: Secondary | ICD-10-CM | POA: Diagnosis not present

## 2016-10-16 DIAGNOSIS — R197 Diarrhea, unspecified: Secondary | ICD-10-CM | POA: Diagnosis not present

## 2016-10-16 DIAGNOSIS — Z4682 Encounter for fitting and adjustment of non-vascular catheter: Secondary | ICD-10-CM | POA: Diagnosis not present

## 2016-10-16 DIAGNOSIS — L291 Pruritus scroti: Secondary | ICD-10-CM | POA: Diagnosis not present

## 2016-10-16 DIAGNOSIS — R935 Abnormal findings on diagnostic imaging of other abdominal regions, including retroperitoneum: Secondary | ICD-10-CM | POA: Diagnosis not present

## 2016-10-16 DIAGNOSIS — D582 Other hemoglobinopathies: Secondary | ICD-10-CM | POA: Diagnosis not present

## 2016-10-16 DIAGNOSIS — A4151 Sepsis due to Escherichia coli [E. coli]: Secondary | ICD-10-CM | POA: Diagnosis not present

## 2016-10-16 DIAGNOSIS — N390 Urinary tract infection, site not specified: Secondary | ICD-10-CM | POA: Diagnosis not present

## 2016-10-16 DIAGNOSIS — J398 Other specified diseases of upper respiratory tract: Secondary | ICD-10-CM | POA: Diagnosis not present

## 2016-10-16 DIAGNOSIS — K266 Chronic or unspecified duodenal ulcer with both hemorrhage and perforation: Secondary | ICD-10-CM | POA: Diagnosis not present

## 2016-10-16 DIAGNOSIS — K6389 Other specified diseases of intestine: Secondary | ICD-10-CM | POA: Diagnosis not present

## 2016-10-16 DIAGNOSIS — M6259 Muscle wasting and atrophy, not elsewhere classified, multiple sites: Secondary | ICD-10-CM | POA: Diagnosis not present

## 2016-10-16 DIAGNOSIS — R262 Difficulty in walking, not elsewhere classified: Secondary | ICD-10-CM | POA: Diagnosis not present

## 2016-10-16 DIAGNOSIS — Z930 Tracheostomy status: Secondary | ICD-10-CM | POA: Diagnosis not present

## 2016-10-16 DIAGNOSIS — E876 Hypokalemia: Secondary | ICD-10-CM | POA: Diagnosis not present

## 2016-10-16 DIAGNOSIS — K269 Duodenal ulcer, unspecified as acute or chronic, without hemorrhage or perforation: Secondary | ICD-10-CM | POA: Diagnosis not present

## 2016-10-16 DIAGNOSIS — K219 Gastro-esophageal reflux disease without esophagitis: Secondary | ICD-10-CM | POA: Diagnosis not present

## 2016-10-16 DIAGNOSIS — E43 Unspecified severe protein-calorie malnutrition: Secondary | ICD-10-CM | POA: Diagnosis not present

## 2016-10-16 DIAGNOSIS — D72829 Elevated white blood cell count, unspecified: Secondary | ICD-10-CM | POA: Diagnosis not present

## 2016-10-16 DIAGNOSIS — L89312 Pressure ulcer of right buttock, stage 2: Secondary | ICD-10-CM | POA: Diagnosis not present

## 2016-10-16 DIAGNOSIS — R633 Feeding difficulties: Secondary | ICD-10-CM | POA: Diagnosis not present

## 2016-10-16 DIAGNOSIS — R238 Other skin changes: Secondary | ICD-10-CM | POA: Diagnosis not present

## 2016-10-16 DIAGNOSIS — D696 Thrombocytopenia, unspecified: Secondary | ICD-10-CM | POA: Diagnosis not present

## 2016-10-16 DIAGNOSIS — R460 Very low level of personal hygiene: Secondary | ICD-10-CM | POA: Diagnosis not present

## 2016-10-16 DIAGNOSIS — K2950 Unspecified chronic gastritis without bleeding: Secondary | ICD-10-CM | POA: Diagnosis not present

## 2016-10-16 DIAGNOSIS — M79675 Pain in left toe(s): Secondary | ICD-10-CM | POA: Diagnosis not present

## 2016-10-16 DIAGNOSIS — K265 Chronic or unspecified duodenal ulcer with perforation: Secondary | ICD-10-CM | POA: Diagnosis not present

## 2016-10-16 DIAGNOSIS — J690 Pneumonitis due to inhalation of food and vomit: Secondary | ICD-10-CM | POA: Diagnosis not present

## 2016-10-16 DIAGNOSIS — B9620 Unspecified Escherichia coli [E. coli] as the cause of diseases classified elsewhere: Secondary | ICD-10-CM | POA: Diagnosis not present

## 2016-10-16 DIAGNOSIS — J9811 Atelectasis: Secondary | ICD-10-CM | POA: Diagnosis not present

## 2016-10-16 DIAGNOSIS — D62 Acute posthemorrhagic anemia: Secondary | ICD-10-CM | POA: Diagnosis not present

## 2016-10-16 DIAGNOSIS — B379 Candidiasis, unspecified: Secondary | ICD-10-CM | POA: Diagnosis not present

## 2016-10-16 DIAGNOSIS — K2970 Gastritis, unspecified, without bleeding: Secondary | ICD-10-CM | POA: Diagnosis not present

## 2016-10-16 DIAGNOSIS — K921 Melena: Secondary | ICD-10-CM | POA: Diagnosis not present

## 2016-10-16 DIAGNOSIS — Z931 Gastrostomy status: Secondary | ICD-10-CM | POA: Diagnosis not present

## 2016-10-16 DIAGNOSIS — C8331 Diffuse large B-cell lymphoma, lymph nodes of head, face, and neck: Secondary | ICD-10-CM | POA: Diagnosis not present

## 2016-10-16 DIAGNOSIS — D649 Anemia, unspecified: Secondary | ICD-10-CM | POA: Diagnosis not present

## 2016-10-16 DIAGNOSIS — L89302 Pressure ulcer of unspecified buttock, stage 2: Secondary | ICD-10-CM | POA: Diagnosis not present

## 2016-10-16 DIAGNOSIS — J90 Pleural effusion, not elsewhere classified: Secondary | ICD-10-CM | POA: Diagnosis not present

## 2016-10-16 DIAGNOSIS — L89151 Pressure ulcer of sacral region, stage 1: Secondary | ICD-10-CM | POA: Diagnosis not present

## 2016-10-16 DIAGNOSIS — R110 Nausea: Secondary | ICD-10-CM | POA: Diagnosis not present

## 2016-10-16 DIAGNOSIS — N1330 Unspecified hydronephrosis: Secondary | ICD-10-CM | POA: Diagnosis not present

## 2016-10-16 DIAGNOSIS — R109 Unspecified abdominal pain: Secondary | ICD-10-CM | POA: Diagnosis not present

## 2016-10-16 DIAGNOSIS — K264 Chronic or unspecified duodenal ulcer with hemorrhage: Secondary | ICD-10-CM | POA: Diagnosis not present

## 2016-10-16 DIAGNOSIS — K267 Chronic duodenal ulcer without hemorrhage or perforation: Secondary | ICD-10-CM | POA: Diagnosis not present

## 2016-10-16 DIAGNOSIS — J349 Unspecified disorder of nose and nasal sinuses: Secondary | ICD-10-CM | POA: Diagnosis not present

## 2016-10-16 DIAGNOSIS — K209 Esophagitis, unspecified: Secondary | ICD-10-CM | POA: Diagnosis not present

## 2016-10-16 DIAGNOSIS — R05 Cough: Secondary | ICD-10-CM | POA: Diagnosis not present

## 2016-10-16 DIAGNOSIS — A419 Sepsis, unspecified organism: Secondary | ICD-10-CM | POA: Diagnosis not present

## 2016-10-16 DIAGNOSIS — Z5111 Encounter for antineoplastic chemotherapy: Secondary | ICD-10-CM | POA: Diagnosis not present

## 2016-10-16 DIAGNOSIS — R140 Abdominal distension (gaseous): Secondary | ICD-10-CM | POA: Diagnosis not present

## 2016-10-16 DIAGNOSIS — K922 Gastrointestinal hemorrhage, unspecified: Secondary | ICD-10-CM | POA: Diagnosis not present

## 2016-10-16 DIAGNOSIS — Z431 Encounter for attention to gastrostomy: Secondary | ICD-10-CM | POA: Diagnosis not present

## 2016-10-16 DIAGNOSIS — R739 Hyperglycemia, unspecified: Secondary | ICD-10-CM | POA: Diagnosis not present

## 2016-10-16 DIAGNOSIS — L98411 Non-pressure chronic ulcer of buttock limited to breakdown of skin: Secondary | ICD-10-CM | POA: Diagnosis not present

## 2016-10-16 DIAGNOSIS — K284 Chronic or unspecified gastrojejunal ulcer with hemorrhage: Secondary | ICD-10-CM | POA: Diagnosis not present

## 2016-10-16 DIAGNOSIS — Z4659 Encounter for fitting and adjustment of other gastrointestinal appliance and device: Secondary | ICD-10-CM | POA: Diagnosis not present

## 2016-10-16 DIAGNOSIS — K567 Ileus, unspecified: Secondary | ICD-10-CM | POA: Diagnosis not present

## 2016-10-16 DIAGNOSIS — J189 Pneumonia, unspecified organism: Secondary | ICD-10-CM | POA: Diagnosis not present

## 2016-10-16 DIAGNOSIS — K631 Perforation of intestine (nontraumatic): Secondary | ICD-10-CM | POA: Diagnosis not present

## 2016-10-16 DIAGNOSIS — L89322 Pressure ulcer of left buttock, stage 2: Secondary | ICD-10-CM | POA: Diagnosis not present

## 2016-10-16 DIAGNOSIS — M79674 Pain in right toe(s): Secondary | ICD-10-CM | POA: Diagnosis not present

## 2016-10-16 DIAGNOSIS — M6281 Muscle weakness (generalized): Secondary | ICD-10-CM | POA: Diagnosis not present

## 2016-10-16 DIAGNOSIS — A498 Other bacterial infections of unspecified site: Secondary | ICD-10-CM | POA: Diagnosis not present

## 2016-10-16 DIAGNOSIS — Z430 Encounter for attention to tracheostomy: Secondary | ICD-10-CM | POA: Diagnosis not present

## 2016-10-16 DIAGNOSIS — R911 Solitary pulmonary nodule: Secondary | ICD-10-CM | POA: Diagnosis not present

## 2016-10-16 DIAGNOSIS — R143 Flatulence: Secondary | ICD-10-CM | POA: Diagnosis not present

## 2016-10-16 DIAGNOSIS — L03039 Cellulitis of unspecified toe: Secondary | ICD-10-CM | POA: Diagnosis not present

## 2016-11-28 DIAGNOSIS — K567 Ileus, unspecified: Secondary | ICD-10-CM | POA: Diagnosis not present

## 2016-12-19 DIAGNOSIS — Z930 Tracheostomy status: Secondary | ICD-10-CM | POA: Diagnosis not present

## 2016-12-19 DIAGNOSIS — C8331 Diffuse large B-cell lymphoma, lymph nodes of head, face, and neck: Secondary | ICD-10-CM | POA: Diagnosis not present

## 2016-12-19 DIAGNOSIS — T8183XA Persistent postprocedural fistula, initial encounter: Secondary | ICD-10-CM | POA: Diagnosis not present

## 2016-12-19 DIAGNOSIS — Z7409 Other reduced mobility: Secondary | ICD-10-CM | POA: Diagnosis not present

## 2016-12-19 DIAGNOSIS — C801 Malignant (primary) neoplasm, unspecified: Secondary | ICD-10-CM | POA: Diagnosis not present

## 2016-12-19 DIAGNOSIS — K922 Gastrointestinal hemorrhage, unspecified: Secondary | ICD-10-CM | POA: Diagnosis not present

## 2016-12-19 DIAGNOSIS — R262 Difficulty in walking, not elsewhere classified: Secondary | ICD-10-CM | POA: Diagnosis not present

## 2016-12-19 DIAGNOSIS — E46 Unspecified protein-calorie malnutrition: Secondary | ICD-10-CM | POA: Diagnosis not present

## 2016-12-19 DIAGNOSIS — E039 Hypothyroidism, unspecified: Secondary | ICD-10-CM | POA: Diagnosis not present

## 2016-12-19 DIAGNOSIS — K5010 Crohn's disease of large intestine without complications: Secondary | ICD-10-CM | POA: Diagnosis not present

## 2016-12-19 DIAGNOSIS — K632 Fistula of intestine: Secondary | ICD-10-CM | POA: Diagnosis not present

## 2016-12-19 DIAGNOSIS — M6281 Muscle weakness (generalized): Secondary | ICD-10-CM | POA: Diagnosis not present

## 2016-12-19 DIAGNOSIS — Z431 Encounter for attention to gastrostomy: Secondary | ICD-10-CM | POA: Diagnosis not present

## 2016-12-19 DIAGNOSIS — E43 Unspecified severe protein-calorie malnutrition: Secondary | ICD-10-CM | POA: Diagnosis not present

## 2016-12-19 DIAGNOSIS — T8131XA Disruption of external operation (surgical) wound, not elsewhere classified, initial encounter: Secondary | ICD-10-CM | POA: Diagnosis not present

## 2016-12-19 DIAGNOSIS — T8130XA Disruption of wound, unspecified, initial encounter: Secondary | ICD-10-CM | POA: Diagnosis not present

## 2016-12-19 DIAGNOSIS — E079 Disorder of thyroid, unspecified: Secondary | ICD-10-CM | POA: Diagnosis not present

## 2016-12-19 DIAGNOSIS — C8330 Diffuse large B-cell lymphoma, unspecified site: Secondary | ICD-10-CM | POA: Diagnosis not present

## 2016-12-19 DIAGNOSIS — A419 Sepsis, unspecified organism: Secondary | ICD-10-CM | POA: Diagnosis not present

## 2016-12-19 DIAGNOSIS — C8510 Unspecified B-cell lymphoma, unspecified site: Secondary | ICD-10-CM | POA: Diagnosis not present

## 2016-12-19 DIAGNOSIS — D61810 Antineoplastic chemotherapy induced pancytopenia: Secondary | ICD-10-CM | POA: Diagnosis not present

## 2016-12-19 DIAGNOSIS — S31109A Unspecified open wound of abdominal wall, unspecified quadrant without penetration into peritoneal cavity, initial encounter: Secondary | ICD-10-CM | POA: Diagnosis not present

## 2016-12-19 DIAGNOSIS — T814XXA Infection following a procedure, initial encounter: Secondary | ICD-10-CM | POA: Diagnosis not present

## 2016-12-19 DIAGNOSIS — J349 Unspecified disorder of nose and nasal sinuses: Secondary | ICD-10-CM | POA: Diagnosis not present

## 2016-12-19 DIAGNOSIS — R6521 Severe sepsis with septic shock: Secondary | ICD-10-CM | POA: Diagnosis not present

## 2016-12-19 DIAGNOSIS — M6259 Muscle wasting and atrophy, not elsewhere classified, multiple sites: Secondary | ICD-10-CM | POA: Diagnosis not present

## 2016-12-19 DIAGNOSIS — J690 Pneumonitis due to inhalation of food and vomit: Secondary | ICD-10-CM | POA: Diagnosis not present

## 2016-12-19 DIAGNOSIS — E870 Hyperosmolality and hypernatremia: Secondary | ICD-10-CM | POA: Diagnosis not present

## 2016-12-19 DIAGNOSIS — K219 Gastro-esophageal reflux disease without esophagitis: Secondary | ICD-10-CM | POA: Diagnosis not present

## 2016-12-19 DIAGNOSIS — Z8719 Personal history of other diseases of the digestive system: Secondary | ICD-10-CM | POA: Diagnosis not present

## 2016-12-20 DIAGNOSIS — K5010 Crohn's disease of large intestine without complications: Secondary | ICD-10-CM | POA: Diagnosis not present

## 2016-12-20 DIAGNOSIS — C8510 Unspecified B-cell lymphoma, unspecified site: Secondary | ICD-10-CM | POA: Diagnosis not present

## 2016-12-20 DIAGNOSIS — E039 Hypothyroidism, unspecified: Secondary | ICD-10-CM | POA: Diagnosis not present

## 2016-12-20 DIAGNOSIS — E46 Unspecified protein-calorie malnutrition: Secondary | ICD-10-CM | POA: Diagnosis not present

## 2016-12-23 DIAGNOSIS — S31109A Unspecified open wound of abdominal wall, unspecified quadrant without penetration into peritoneal cavity, initial encounter: Secondary | ICD-10-CM | POA: Diagnosis not present

## 2016-12-25 DIAGNOSIS — R300 Dysuria: Secondary | ICD-10-CM | POA: Diagnosis not present

## 2016-12-25 DIAGNOSIS — R809 Proteinuria, unspecified: Secondary | ICD-10-CM | POA: Diagnosis not present

## 2016-12-25 DIAGNOSIS — R093 Abnormal sputum: Secondary | ICD-10-CM | POA: Diagnosis not present

## 2016-12-25 DIAGNOSIS — B9620 Unspecified Escherichia coli [E. coli] as the cause of diseases classified elsewhere: Secondary | ICD-10-CM | POA: Diagnosis not present

## 2016-12-25 DIAGNOSIS — R197 Diarrhea, unspecified: Secondary | ICD-10-CM | POA: Diagnosis not present

## 2016-12-25 DIAGNOSIS — J810 Acute pulmonary edema: Secondary | ICD-10-CM | POA: Diagnosis not present

## 2016-12-25 DIAGNOSIS — E8779 Other fluid overload: Secondary | ICD-10-CM | POA: Diagnosis not present

## 2016-12-25 DIAGNOSIS — R1312 Dysphagia, oropharyngeal phase: Secondary | ICD-10-CM | POA: Diagnosis not present

## 2016-12-25 DIAGNOSIS — E440 Moderate protein-calorie malnutrition: Secondary | ICD-10-CM | POA: Diagnosis not present

## 2016-12-25 DIAGNOSIS — R9431 Abnormal electrocardiogram [ECG] [EKG]: Secondary | ICD-10-CM | POA: Diagnosis not present

## 2016-12-25 DIAGNOSIS — R0902 Hypoxemia: Secondary | ICD-10-CM | POA: Diagnosis not present

## 2016-12-25 DIAGNOSIS — R1310 Dysphagia, unspecified: Secondary | ICD-10-CM | POA: Diagnosis not present

## 2016-12-25 DIAGNOSIS — K631 Perforation of intestine (nontraumatic): Secondary | ICD-10-CM | POA: Diagnosis not present

## 2016-12-25 DIAGNOSIS — R195 Other fecal abnormalities: Secondary | ICD-10-CM | POA: Diagnosis not present

## 2016-12-25 DIAGNOSIS — C8338 Diffuse large B-cell lymphoma, lymph nodes of multiple sites: Secondary | ICD-10-CM | POA: Diagnosis not present

## 2016-12-25 DIAGNOSIS — T451X5A Adverse effect of antineoplastic and immunosuppressive drugs, initial encounter: Secondary | ICD-10-CM | POA: Diagnosis not present

## 2016-12-25 DIAGNOSIS — Z931 Gastrostomy status: Secondary | ICD-10-CM | POA: Diagnosis not present

## 2016-12-25 DIAGNOSIS — J398 Other specified diseases of upper respiratory tract: Secondary | ICD-10-CM | POA: Diagnosis not present

## 2016-12-25 DIAGNOSIS — K632 Fistula of intestine: Secondary | ICD-10-CM | POA: Diagnosis not present

## 2016-12-25 DIAGNOSIS — R918 Other nonspecific abnormal finding of lung field: Secondary | ICD-10-CM | POA: Diagnosis not present

## 2016-12-25 DIAGNOSIS — J155 Pneumonia due to Escherichia coli: Secondary | ICD-10-CM | POA: Diagnosis not present

## 2016-12-25 DIAGNOSIS — T814XXA Infection following a procedure, initial encounter: Secondary | ICD-10-CM | POA: Diagnosis not present

## 2016-12-25 DIAGNOSIS — E079 Disorder of thyroid, unspecified: Secondary | ICD-10-CM | POA: Diagnosis not present

## 2016-12-25 DIAGNOSIS — E039 Hypothyroidism, unspecified: Secondary | ICD-10-CM | POA: Diagnosis not present

## 2016-12-25 DIAGNOSIS — T8130XD Disruption of wound, unspecified, subsequent encounter: Secondary | ICD-10-CM | POA: Diagnosis not present

## 2016-12-25 DIAGNOSIS — D709 Neutropenia, unspecified: Secondary | ICD-10-CM | POA: Diagnosis not present

## 2016-12-25 DIAGNOSIS — J984 Other disorders of lung: Secondary | ICD-10-CM | POA: Diagnosis not present

## 2016-12-25 DIAGNOSIS — B952 Enterococcus as the cause of diseases classified elsewhere: Secondary | ICD-10-CM | POA: Diagnosis not present

## 2016-12-25 DIAGNOSIS — Z95828 Presence of other vascular implants and grafts: Secondary | ICD-10-CM | POA: Diagnosis not present

## 2016-12-25 DIAGNOSIS — T8130XA Disruption of wound, unspecified, initial encounter: Secondary | ICD-10-CM | POA: Diagnosis not present

## 2016-12-25 DIAGNOSIS — A419 Sepsis, unspecified organism: Secondary | ICD-10-CM | POA: Diagnosis not present

## 2016-12-25 DIAGNOSIS — D649 Anemia, unspecified: Secondary | ICD-10-CM | POA: Diagnosis not present

## 2016-12-25 DIAGNOSIS — Z201 Contact with and (suspected) exposure to tuberculosis: Secondary | ICD-10-CM | POA: Diagnosis not present

## 2016-12-25 DIAGNOSIS — K5090 Crohn's disease, unspecified, without complications: Secondary | ICD-10-CM | POA: Diagnosis not present

## 2016-12-25 DIAGNOSIS — Z8719 Personal history of other diseases of the digestive system: Secondary | ICD-10-CM | POA: Diagnosis not present

## 2016-12-25 DIAGNOSIS — R633 Feeding difficulties: Secondary | ICD-10-CM | POA: Diagnosis not present

## 2016-12-25 DIAGNOSIS — D703 Neutropenia due to infection: Secondary | ICD-10-CM | POA: Diagnosis not present

## 2016-12-25 DIAGNOSIS — R509 Fever, unspecified: Secondary | ICD-10-CM | POA: Diagnosis not present

## 2016-12-25 DIAGNOSIS — E43 Unspecified severe protein-calorie malnutrition: Secondary | ICD-10-CM | POA: Diagnosis not present

## 2016-12-25 DIAGNOSIS — I959 Hypotension, unspecified: Secondary | ICD-10-CM | POA: Diagnosis not present

## 2016-12-25 DIAGNOSIS — R6521 Severe sepsis with septic shock: Secondary | ICD-10-CM | POA: Diagnosis not present

## 2016-12-25 DIAGNOSIS — E46 Unspecified protein-calorie malnutrition: Secondary | ICD-10-CM | POA: Diagnosis not present

## 2016-12-25 DIAGNOSIS — J188 Other pneumonia, unspecified organism: Secondary | ICD-10-CM | POA: Diagnosis not present

## 2016-12-25 DIAGNOSIS — Z7409 Other reduced mobility: Secondary | ICD-10-CM | POA: Diagnosis not present

## 2016-12-25 DIAGNOSIS — E870 Hyperosmolality and hypernatremia: Secondary | ICD-10-CM | POA: Diagnosis not present

## 2016-12-25 DIAGNOSIS — C8330 Diffuse large B-cell lymphoma, unspecified site: Secondary | ICD-10-CM | POA: Diagnosis not present

## 2016-12-25 DIAGNOSIS — N390 Urinary tract infection, site not specified: Secondary | ICD-10-CM | POA: Diagnosis not present

## 2016-12-25 DIAGNOSIS — D899 Disorder involving the immune mechanism, unspecified: Secondary | ICD-10-CM | POA: Diagnosis not present

## 2016-12-25 DIAGNOSIS — J9691 Respiratory failure, unspecified with hypoxia: Secondary | ICD-10-CM | POA: Diagnosis not present

## 2016-12-25 DIAGNOSIS — J90 Pleural effusion, not elsewhere classified: Secondary | ICD-10-CM | POA: Diagnosis not present

## 2016-12-25 DIAGNOSIS — S31109A Unspecified open wound of abdominal wall, unspecified quadrant without penetration into peritoneal cavity, initial encounter: Secondary | ICD-10-CM | POA: Diagnosis not present

## 2016-12-25 DIAGNOSIS — R0600 Dyspnea, unspecified: Secondary | ICD-10-CM | POA: Diagnosis not present

## 2016-12-25 DIAGNOSIS — J690 Pneumonitis due to inhalation of food and vomit: Secondary | ICD-10-CM | POA: Diagnosis not present

## 2016-12-25 DIAGNOSIS — T8131XA Disruption of external operation (surgical) wound, not elsewhere classified, initial encounter: Secondary | ICD-10-CM | POA: Diagnosis not present

## 2016-12-25 DIAGNOSIS — C8331 Diffuse large B-cell lymphoma, lymph nodes of head, face, and neck: Secondary | ICD-10-CM | POA: Diagnosis not present

## 2016-12-25 DIAGNOSIS — T8130XS Disruption of wound, unspecified, sequela: Secondary | ICD-10-CM | POA: Diagnosis not present

## 2016-12-25 DIAGNOSIS — A498 Other bacterial infections of unspecified site: Secondary | ICD-10-CM | POA: Diagnosis not present

## 2016-12-25 DIAGNOSIS — R8279 Other abnormal findings on microbiological examination of urine: Secondary | ICD-10-CM | POA: Diagnosis not present

## 2016-12-25 DIAGNOSIS — E8770 Fluid overload, unspecified: Secondary | ICD-10-CM | POA: Diagnosis not present

## 2016-12-25 DIAGNOSIS — E049 Nontoxic goiter, unspecified: Secondary | ICD-10-CM | POA: Diagnosis not present

## 2016-12-25 DIAGNOSIS — Z136 Encounter for screening for cardiovascular disorders: Secondary | ICD-10-CM | POA: Diagnosis not present

## 2016-12-25 DIAGNOSIS — D849 Immunodeficiency, unspecified: Secondary | ICD-10-CM | POA: Diagnosis not present

## 2016-12-25 DIAGNOSIS — Z9981 Dependence on supplemental oxygen: Secondary | ICD-10-CM | POA: Diagnosis not present

## 2016-12-25 DIAGNOSIS — J189 Pneumonia, unspecified organism: Secondary | ICD-10-CM | POA: Diagnosis not present

## 2016-12-25 DIAGNOSIS — R935 Abnormal findings on diagnostic imaging of other abdominal regions, including retroperitoneum: Secondary | ICD-10-CM | POA: Diagnosis not present

## 2016-12-25 DIAGNOSIS — R109 Unspecified abdominal pain: Secondary | ICD-10-CM | POA: Diagnosis not present

## 2016-12-25 DIAGNOSIS — Z9049 Acquired absence of other specified parts of digestive tract: Secondary | ICD-10-CM | POA: Diagnosis not present

## 2016-12-25 DIAGNOSIS — R5081 Fever presenting with conditions classified elsewhere: Secondary | ICD-10-CM | POA: Diagnosis not present

## 2016-12-25 DIAGNOSIS — Z930 Tracheostomy status: Secondary | ICD-10-CM | POA: Diagnosis not present

## 2016-12-25 DIAGNOSIS — T8183XA Persistent postprocedural fistula, initial encounter: Secondary | ICD-10-CM | POA: Diagnosis not present

## 2016-12-25 DIAGNOSIS — R000 Tachycardia, unspecified: Secondary | ICD-10-CM | POA: Diagnosis not present

## 2016-12-25 DIAGNOSIS — N200 Calculus of kidney: Secondary | ICD-10-CM | POA: Diagnosis not present

## 2016-12-25 DIAGNOSIS — D61818 Other pancytopenia: Secondary | ICD-10-CM | POA: Diagnosis not present

## 2016-12-25 DIAGNOSIS — K567 Ileus, unspecified: Secondary | ICD-10-CM | POA: Diagnosis not present

## 2016-12-25 DIAGNOSIS — E876 Hypokalemia: Secondary | ICD-10-CM | POA: Diagnosis not present

## 2016-12-25 DIAGNOSIS — R140 Abdominal distension (gaseous): Secondary | ICD-10-CM | POA: Diagnosis not present

## 2016-12-25 DIAGNOSIS — D61810 Antineoplastic chemotherapy induced pancytopenia: Secondary | ICD-10-CM | POA: Diagnosis not present

## 2016-12-25 DIAGNOSIS — R110 Nausea: Secondary | ICD-10-CM | POA: Diagnosis not present

## 2016-12-26 DIAGNOSIS — S31109A Unspecified open wound of abdominal wall, unspecified quadrant without penetration into peritoneal cavity, initial encounter: Secondary | ICD-10-CM | POA: Diagnosis not present

## 2016-12-26 DIAGNOSIS — Z8719 Personal history of other diseases of the digestive system: Secondary | ICD-10-CM | POA: Diagnosis not present

## 2016-12-27 DIAGNOSIS — S31109A Unspecified open wound of abdominal wall, unspecified quadrant without penetration into peritoneal cavity, initial encounter: Secondary | ICD-10-CM | POA: Diagnosis not present

## 2016-12-27 DIAGNOSIS — T814XXA Infection following a procedure, initial encounter: Secondary | ICD-10-CM | POA: Diagnosis not present

## 2016-12-27 DIAGNOSIS — C8330 Diffuse large B-cell lymphoma, unspecified site: Secondary | ICD-10-CM | POA: Diagnosis not present

## 2017-01-10 DIAGNOSIS — C8338 Diffuse large B-cell lymphoma, lymph nodes of multiple sites: Secondary | ICD-10-CM | POA: Diagnosis not present

## 2017-01-10 DIAGNOSIS — R918 Other nonspecific abnormal finding of lung field: Secondary | ICD-10-CM | POA: Diagnosis not present

## 2017-02-25 DIAGNOSIS — K632 Fistula of intestine: Secondary | ICD-10-CM | POA: Diagnosis not present

## 2017-02-25 DIAGNOSIS — T8131XD Disruption of external operation (surgical) wound, not elsewhere classified, subsequent encounter: Secondary | ICD-10-CM | POA: Diagnosis not present

## 2017-02-25 DIAGNOSIS — E43 Unspecified severe protein-calorie malnutrition: Secondary | ICD-10-CM | POA: Diagnosis not present

## 2017-02-25 DIAGNOSIS — D709 Neutropenia, unspecified: Secondary | ICD-10-CM | POA: Diagnosis not present

## 2017-02-25 DIAGNOSIS — C8338 Diffuse large B-cell lymphoma, lymph nodes of multiple sites: Secondary | ICD-10-CM | POA: Diagnosis not present

## 2017-02-25 DIAGNOSIS — Z8701 Personal history of pneumonia (recurrent): Secondary | ICD-10-CM | POA: Diagnosis not present

## 2017-02-28 DIAGNOSIS — D709 Neutropenia, unspecified: Secondary | ICD-10-CM | POA: Diagnosis not present

## 2017-02-28 DIAGNOSIS — Z1389 Encounter for screening for other disorder: Secondary | ICD-10-CM | POA: Diagnosis not present

## 2017-02-28 DIAGNOSIS — Z139 Encounter for screening, unspecified: Secondary | ICD-10-CM | POA: Diagnosis not present

## 2017-02-28 DIAGNOSIS — C8338 Diffuse large B-cell lymphoma, lymph nodes of multiple sites: Secondary | ICD-10-CM | POA: Diagnosis not present

## 2017-02-28 DIAGNOSIS — J690 Pneumonitis due to inhalation of food and vomit: Secondary | ICD-10-CM | POA: Diagnosis not present

## 2017-02-28 DIAGNOSIS — C8331 Diffuse large B-cell lymphoma, lymph nodes of head, face, and neck: Secondary | ICD-10-CM | POA: Diagnosis not present

## 2017-02-28 DIAGNOSIS — R5081 Fever presenting with conditions classified elsewhere: Secondary | ICD-10-CM | POA: Diagnosis not present

## 2017-02-28 DIAGNOSIS — K264 Chronic or unspecified duodenal ulcer with hemorrhage: Secondary | ICD-10-CM | POA: Diagnosis not present

## 2017-02-28 DIAGNOSIS — T8130XA Disruption of wound, unspecified, initial encounter: Secondary | ICD-10-CM | POA: Diagnosis not present

## 2017-02-28 DIAGNOSIS — E43 Unspecified severe protein-calorie malnutrition: Secondary | ICD-10-CM | POA: Diagnosis not present

## 2017-02-28 DIAGNOSIS — Z8701 Personal history of pneumonia (recurrent): Secondary | ICD-10-CM | POA: Diagnosis not present

## 2017-02-28 DIAGNOSIS — Z9181 History of falling: Secondary | ICD-10-CM | POA: Diagnosis not present

## 2017-02-28 DIAGNOSIS — K632 Fistula of intestine: Secondary | ICD-10-CM | POA: Diagnosis not present

## 2017-02-28 DIAGNOSIS — T8131XD Disruption of external operation (surgical) wound, not elsewhere classified, subsequent encounter: Secondary | ICD-10-CM | POA: Diagnosis not present

## 2017-03-03 DIAGNOSIS — Z8701 Personal history of pneumonia (recurrent): Secondary | ICD-10-CM | POA: Diagnosis not present

## 2017-03-03 DIAGNOSIS — K632 Fistula of intestine: Secondary | ICD-10-CM | POA: Diagnosis not present

## 2017-03-03 DIAGNOSIS — D709 Neutropenia, unspecified: Secondary | ICD-10-CM | POA: Diagnosis not present

## 2017-03-03 DIAGNOSIS — T8131XD Disruption of external operation (surgical) wound, not elsewhere classified, subsequent encounter: Secondary | ICD-10-CM | POA: Diagnosis not present

## 2017-03-03 DIAGNOSIS — E43 Unspecified severe protein-calorie malnutrition: Secondary | ICD-10-CM | POA: Diagnosis not present

## 2017-03-03 DIAGNOSIS — C8338 Diffuse large B-cell lymphoma, lymph nodes of multiple sites: Secondary | ICD-10-CM | POA: Diagnosis not present

## 2017-03-05 DIAGNOSIS — T8131XD Disruption of external operation (surgical) wound, not elsewhere classified, subsequent encounter: Secondary | ICD-10-CM | POA: Diagnosis not present

## 2017-03-05 DIAGNOSIS — E43 Unspecified severe protein-calorie malnutrition: Secondary | ICD-10-CM | POA: Diagnosis not present

## 2017-03-05 DIAGNOSIS — E049 Nontoxic goiter, unspecified: Secondary | ICD-10-CM | POA: Diagnosis not present

## 2017-03-05 DIAGNOSIS — Z8701 Personal history of pneumonia (recurrent): Secondary | ICD-10-CM | POA: Diagnosis not present

## 2017-03-05 DIAGNOSIS — D709 Neutropenia, unspecified: Secondary | ICD-10-CM | POA: Diagnosis not present

## 2017-03-05 DIAGNOSIS — K632 Fistula of intestine: Secondary | ICD-10-CM | POA: Diagnosis not present

## 2017-03-05 DIAGNOSIS — K631 Perforation of intestine (nontraumatic): Secondary | ICD-10-CM | POA: Diagnosis not present

## 2017-03-05 DIAGNOSIS — C8331 Diffuse large B-cell lymphoma, lymph nodes of head, face, and neck: Secondary | ICD-10-CM | POA: Diagnosis not present

## 2017-03-05 DIAGNOSIS — R109 Unspecified abdominal pain: Secondary | ICD-10-CM | POA: Diagnosis not present

## 2017-03-05 DIAGNOSIS — C8338 Diffuse large B-cell lymphoma, lymph nodes of multiple sites: Secondary | ICD-10-CM | POA: Diagnosis not present

## 2017-03-06 DIAGNOSIS — J398 Other specified diseases of upper respiratory tract: Secondary | ICD-10-CM | POA: Diagnosis not present

## 2017-03-06 DIAGNOSIS — E049 Nontoxic goiter, unspecified: Secondary | ICD-10-CM | POA: Diagnosis not present

## 2017-03-06 DIAGNOSIS — Z930 Tracheostomy status: Secondary | ICD-10-CM | POA: Diagnosis not present

## 2017-03-06 DIAGNOSIS — E43 Unspecified severe protein-calorie malnutrition: Secondary | ICD-10-CM | POA: Diagnosis not present

## 2017-03-06 DIAGNOSIS — R5081 Fever presenting with conditions classified elsewhere: Secondary | ICD-10-CM | POA: Diagnosis not present

## 2017-03-06 DIAGNOSIS — C8338 Diffuse large B-cell lymphoma, lymph nodes of multiple sites: Secondary | ICD-10-CM | POA: Diagnosis not present

## 2017-03-06 DIAGNOSIS — E079 Disorder of thyroid, unspecified: Secondary | ICD-10-CM | POA: Diagnosis not present

## 2017-03-06 DIAGNOSIS — K632 Fistula of intestine: Secondary | ICD-10-CM | POA: Diagnosis not present

## 2017-03-06 DIAGNOSIS — T8131XD Disruption of external operation (surgical) wound, not elsewhere classified, subsequent encounter: Secondary | ICD-10-CM | POA: Diagnosis not present

## 2017-03-06 DIAGNOSIS — Z8701 Personal history of pneumonia (recurrent): Secondary | ICD-10-CM | POA: Diagnosis not present

## 2017-03-06 DIAGNOSIS — C8331 Diffuse large B-cell lymphoma, lymph nodes of head, face, and neck: Secondary | ICD-10-CM | POA: Diagnosis not present

## 2017-03-06 DIAGNOSIS — D709 Neutropenia, unspecified: Secondary | ICD-10-CM | POA: Diagnosis not present

## 2017-03-07 DIAGNOSIS — C8331 Diffuse large B-cell lymphoma, lymph nodes of head, face, and neck: Secondary | ICD-10-CM | POA: Diagnosis not present

## 2017-03-07 DIAGNOSIS — N281 Cyst of kidney, acquired: Secondary | ICD-10-CM | POA: Diagnosis not present

## 2017-03-07 DIAGNOSIS — R933 Abnormal findings on diagnostic imaging of other parts of digestive tract: Secondary | ICD-10-CM | POA: Diagnosis not present

## 2017-03-07 DIAGNOSIS — I700 Atherosclerosis of aorta: Secondary | ICD-10-CM | POA: Diagnosis not present

## 2017-03-07 DIAGNOSIS — I2510 Atherosclerotic heart disease of native coronary artery without angina pectoris: Secondary | ICD-10-CM | POA: Diagnosis not present

## 2017-03-07 DIAGNOSIS — N323 Diverticulum of bladder: Secondary | ICD-10-CM | POA: Diagnosis not present

## 2017-03-07 DIAGNOSIS — M47819 Spondylosis without myelopathy or radiculopathy, site unspecified: Secondary | ICD-10-CM | POA: Diagnosis not present

## 2017-03-07 DIAGNOSIS — R918 Other nonspecific abnormal finding of lung field: Secondary | ICD-10-CM | POA: Diagnosis not present

## 2017-03-07 DIAGNOSIS — Z923 Personal history of irradiation: Secondary | ICD-10-CM | POA: Diagnosis not present

## 2017-03-11 DIAGNOSIS — C8338 Diffuse large B-cell lymphoma, lymph nodes of multiple sites: Secondary | ICD-10-CM | POA: Diagnosis not present

## 2017-03-11 DIAGNOSIS — D709 Neutropenia, unspecified: Secondary | ICD-10-CM | POA: Diagnosis not present

## 2017-03-11 DIAGNOSIS — K632 Fistula of intestine: Secondary | ICD-10-CM | POA: Diagnosis not present

## 2017-03-11 DIAGNOSIS — Z8701 Personal history of pneumonia (recurrent): Secondary | ICD-10-CM | POA: Diagnosis not present

## 2017-03-11 DIAGNOSIS — T8131XD Disruption of external operation (surgical) wound, not elsewhere classified, subsequent encounter: Secondary | ICD-10-CM | POA: Diagnosis not present

## 2017-03-11 DIAGNOSIS — E43 Unspecified severe protein-calorie malnutrition: Secondary | ICD-10-CM | POA: Diagnosis not present

## 2017-03-12 DIAGNOSIS — J9601 Acute respiratory failure with hypoxia: Secondary | ICD-10-CM | POA: Diagnosis not present

## 2017-03-12 DIAGNOSIS — R0902 Hypoxemia: Secondary | ICD-10-CM | POA: Diagnosis not present

## 2017-03-12 DIAGNOSIS — Z9981 Dependence on supplemental oxygen: Secondary | ICD-10-CM | POA: Diagnosis not present

## 2017-03-12 DIAGNOSIS — C8331 Diffuse large B-cell lymphoma, lymph nodes of head, face, and neck: Secondary | ICD-10-CM | POA: Diagnosis not present

## 2017-03-12 DIAGNOSIS — I358 Other nonrheumatic aortic valve disorders: Secondary | ICD-10-CM | POA: Diagnosis not present

## 2017-03-12 DIAGNOSIS — J189 Pneumonia, unspecified organism: Secondary | ICD-10-CM | POA: Diagnosis not present

## 2017-03-12 DIAGNOSIS — E440 Moderate protein-calorie malnutrition: Secondary | ICD-10-CM | POA: Diagnosis not present

## 2017-03-12 DIAGNOSIS — C8190 Hodgkin lymphoma, unspecified, unspecified site: Secondary | ICD-10-CM | POA: Diagnosis not present

## 2017-03-12 DIAGNOSIS — N390 Urinary tract infection, site not specified: Secondary | ICD-10-CM | POA: Diagnosis not present

## 2017-03-12 DIAGNOSIS — D61818 Other pancytopenia: Secondary | ICD-10-CM | POA: Diagnosis not present

## 2017-03-12 DIAGNOSIS — R001 Bradycardia, unspecified: Secondary | ICD-10-CM | POA: Diagnosis not present

## 2017-03-12 DIAGNOSIS — I959 Hypotension, unspecified: Secondary | ICD-10-CM | POA: Diagnosis not present

## 2017-03-12 DIAGNOSIS — R1312 Dysphagia, oropharyngeal phase: Secondary | ICD-10-CM | POA: Diagnosis not present

## 2017-03-12 DIAGNOSIS — J690 Pneumonitis due to inhalation of food and vomit: Secondary | ICD-10-CM | POA: Diagnosis not present

## 2017-03-12 DIAGNOSIS — K632 Fistula of intestine: Secondary | ICD-10-CM | POA: Diagnosis not present

## 2017-03-12 DIAGNOSIS — E039 Hypothyroidism, unspecified: Secondary | ICD-10-CM | POA: Diagnosis not present

## 2017-03-12 DIAGNOSIS — R197 Diarrhea, unspecified: Secondary | ICD-10-CM | POA: Diagnosis not present

## 2017-03-12 DIAGNOSIS — Z7409 Other reduced mobility: Secondary | ICD-10-CM | POA: Diagnosis not present

## 2017-03-12 DIAGNOSIS — K921 Melena: Secondary | ICD-10-CM | POA: Diagnosis not present

## 2017-03-12 DIAGNOSIS — N179 Acute kidney failure, unspecified: Secondary | ICD-10-CM | POA: Diagnosis not present

## 2017-03-12 DIAGNOSIS — A419 Sepsis, unspecified organism: Secondary | ICD-10-CM | POA: Diagnosis not present

## 2017-03-12 DIAGNOSIS — A046 Enteritis due to Yersinia enterocolitica: Secondary | ICD-10-CM | POA: Diagnosis not present

## 2017-03-12 DIAGNOSIS — E43 Unspecified severe protein-calorie malnutrition: Secondary | ICD-10-CM | POA: Diagnosis not present

## 2017-03-12 DIAGNOSIS — I517 Cardiomegaly: Secondary | ICD-10-CM | POA: Diagnosis not present

## 2017-03-12 DIAGNOSIS — D61810 Antineoplastic chemotherapy induced pancytopenia: Secondary | ICD-10-CM | POA: Diagnosis not present

## 2017-03-21 DIAGNOSIS — Z8701 Personal history of pneumonia (recurrent): Secondary | ICD-10-CM | POA: Diagnosis not present

## 2017-03-21 DIAGNOSIS — T8131XD Disruption of external operation (surgical) wound, not elsewhere classified, subsequent encounter: Secondary | ICD-10-CM | POA: Diagnosis not present

## 2017-03-21 DIAGNOSIS — K632 Fistula of intestine: Secondary | ICD-10-CM | POA: Diagnosis not present

## 2017-03-21 DIAGNOSIS — D709 Neutropenia, unspecified: Secondary | ICD-10-CM | POA: Diagnosis not present

## 2017-03-21 DIAGNOSIS — E43 Unspecified severe protein-calorie malnutrition: Secondary | ICD-10-CM | POA: Diagnosis not present

## 2017-03-21 DIAGNOSIS — C8338 Diffuse large B-cell lymphoma, lymph nodes of multiple sites: Secondary | ICD-10-CM | POA: Diagnosis not present

## 2017-03-24 DIAGNOSIS — T8131XD Disruption of external operation (surgical) wound, not elsewhere classified, subsequent encounter: Secondary | ICD-10-CM | POA: Diagnosis not present

## 2017-03-24 DIAGNOSIS — C8338 Diffuse large B-cell lymphoma, lymph nodes of multiple sites: Secondary | ICD-10-CM | POA: Diagnosis not present

## 2017-03-24 DIAGNOSIS — K632 Fistula of intestine: Secondary | ICD-10-CM | POA: Diagnosis not present

## 2017-03-24 DIAGNOSIS — E43 Unspecified severe protein-calorie malnutrition: Secondary | ICD-10-CM | POA: Diagnosis not present

## 2017-03-24 DIAGNOSIS — E440 Moderate protein-calorie malnutrition: Secondary | ICD-10-CM | POA: Diagnosis not present

## 2017-03-24 DIAGNOSIS — D709 Neutropenia, unspecified: Secondary | ICD-10-CM | POA: Diagnosis not present

## 2017-03-24 DIAGNOSIS — Z8701 Personal history of pneumonia (recurrent): Secondary | ICD-10-CM | POA: Diagnosis not present

## 2017-03-26 DIAGNOSIS — T8131XD Disruption of external operation (surgical) wound, not elsewhere classified, subsequent encounter: Secondary | ICD-10-CM | POA: Diagnosis not present

## 2017-03-26 DIAGNOSIS — K632 Fistula of intestine: Secondary | ICD-10-CM | POA: Diagnosis not present

## 2017-03-26 DIAGNOSIS — C8338 Diffuse large B-cell lymphoma, lymph nodes of multiple sites: Secondary | ICD-10-CM | POA: Diagnosis not present

## 2017-03-26 DIAGNOSIS — D709 Neutropenia, unspecified: Secondary | ICD-10-CM | POA: Diagnosis not present

## 2017-03-26 DIAGNOSIS — Z8701 Personal history of pneumonia (recurrent): Secondary | ICD-10-CM | POA: Diagnosis not present

## 2017-03-26 DIAGNOSIS — E43 Unspecified severe protein-calorie malnutrition: Secondary | ICD-10-CM | POA: Diagnosis not present

## 2017-03-27 DIAGNOSIS — D61818 Other pancytopenia: Secondary | ICD-10-CM | POA: Diagnosis not present

## 2017-03-27 DIAGNOSIS — E43 Unspecified severe protein-calorie malnutrition: Secondary | ICD-10-CM | POA: Diagnosis not present

## 2017-03-27 DIAGNOSIS — K632 Fistula of intestine: Secondary | ICD-10-CM | POA: Diagnosis not present

## 2017-03-27 DIAGNOSIS — T8131XD Disruption of external operation (surgical) wound, not elsewhere classified, subsequent encounter: Secondary | ICD-10-CM | POA: Diagnosis not present

## 2017-03-27 DIAGNOSIS — Z8701 Personal history of pneumonia (recurrent): Secondary | ICD-10-CM | POA: Diagnosis not present

## 2017-03-27 DIAGNOSIS — E440 Moderate protein-calorie malnutrition: Secondary | ICD-10-CM | POA: Diagnosis not present

## 2017-03-27 DIAGNOSIS — D709 Neutropenia, unspecified: Secondary | ICD-10-CM | POA: Diagnosis not present

## 2017-03-27 DIAGNOSIS — C8338 Diffuse large B-cell lymphoma, lymph nodes of multiple sites: Secondary | ICD-10-CM | POA: Diagnosis not present

## 2017-03-31 DIAGNOSIS — D709 Neutropenia, unspecified: Secondary | ICD-10-CM | POA: Diagnosis not present

## 2017-03-31 DIAGNOSIS — Z8701 Personal history of pneumonia (recurrent): Secondary | ICD-10-CM | POA: Diagnosis not present

## 2017-03-31 DIAGNOSIS — D61818 Other pancytopenia: Secondary | ICD-10-CM | POA: Diagnosis not present

## 2017-03-31 DIAGNOSIS — E43 Unspecified severe protein-calorie malnutrition: Secondary | ICD-10-CM | POA: Diagnosis not present

## 2017-03-31 DIAGNOSIS — E440 Moderate protein-calorie malnutrition: Secondary | ICD-10-CM | POA: Diagnosis not present

## 2017-03-31 DIAGNOSIS — T8131XD Disruption of external operation (surgical) wound, not elsewhere classified, subsequent encounter: Secondary | ICD-10-CM | POA: Diagnosis not present

## 2017-03-31 DIAGNOSIS — C8338 Diffuse large B-cell lymphoma, lymph nodes of multiple sites: Secondary | ICD-10-CM | POA: Diagnosis not present

## 2017-03-31 DIAGNOSIS — K632 Fistula of intestine: Secondary | ICD-10-CM | POA: Diagnosis not present

## 2017-04-02 DIAGNOSIS — D709 Neutropenia, unspecified: Secondary | ICD-10-CM | POA: Diagnosis not present

## 2017-04-02 DIAGNOSIS — T8131XD Disruption of external operation (surgical) wound, not elsewhere classified, subsequent encounter: Secondary | ICD-10-CM | POA: Diagnosis not present

## 2017-04-02 DIAGNOSIS — E43 Unspecified severe protein-calorie malnutrition: Secondary | ICD-10-CM | POA: Diagnosis not present

## 2017-04-02 DIAGNOSIS — Z8701 Personal history of pneumonia (recurrent): Secondary | ICD-10-CM | POA: Diagnosis not present

## 2017-04-02 DIAGNOSIS — K632 Fistula of intestine: Secondary | ICD-10-CM | POA: Diagnosis not present

## 2017-04-02 DIAGNOSIS — C8338 Diffuse large B-cell lymphoma, lymph nodes of multiple sites: Secondary | ICD-10-CM | POA: Diagnosis not present

## 2017-04-03 DIAGNOSIS — T8131XD Disruption of external operation (surgical) wound, not elsewhere classified, subsequent encounter: Secondary | ICD-10-CM | POA: Diagnosis not present

## 2017-04-03 DIAGNOSIS — K632 Fistula of intestine: Secondary | ICD-10-CM | POA: Diagnosis not present

## 2017-04-03 DIAGNOSIS — C8338 Diffuse large B-cell lymphoma, lymph nodes of multiple sites: Secondary | ICD-10-CM | POA: Diagnosis not present

## 2017-04-03 DIAGNOSIS — E43 Unspecified severe protein-calorie malnutrition: Secondary | ICD-10-CM | POA: Diagnosis not present

## 2017-04-03 DIAGNOSIS — Z8701 Personal history of pneumonia (recurrent): Secondary | ICD-10-CM | POA: Diagnosis not present

## 2017-04-03 DIAGNOSIS — D61818 Other pancytopenia: Secondary | ICD-10-CM | POA: Diagnosis not present

## 2017-04-03 DIAGNOSIS — D709 Neutropenia, unspecified: Secondary | ICD-10-CM | POA: Diagnosis not present

## 2017-04-05 DIAGNOSIS — C8331 Diffuse large B-cell lymphoma, lymph nodes of head, face, and neck: Secondary | ICD-10-CM | POA: Diagnosis not present

## 2017-04-05 DIAGNOSIS — R109 Unspecified abdominal pain: Secondary | ICD-10-CM | POA: Diagnosis not present

## 2017-04-05 DIAGNOSIS — E049 Nontoxic goiter, unspecified: Secondary | ICD-10-CM | POA: Diagnosis not present

## 2017-04-05 DIAGNOSIS — K631 Perforation of intestine (nontraumatic): Secondary | ICD-10-CM | POA: Diagnosis not present

## 2017-04-06 DIAGNOSIS — C8331 Diffuse large B-cell lymphoma, lymph nodes of head, face, and neck: Secondary | ICD-10-CM | POA: Diagnosis not present

## 2017-04-06 DIAGNOSIS — E079 Disorder of thyroid, unspecified: Secondary | ICD-10-CM | POA: Diagnosis not present

## 2017-04-06 DIAGNOSIS — E049 Nontoxic goiter, unspecified: Secondary | ICD-10-CM | POA: Diagnosis not present

## 2017-04-06 DIAGNOSIS — Z930 Tracheostomy status: Secondary | ICD-10-CM | POA: Diagnosis not present

## 2017-04-06 DIAGNOSIS — J398 Other specified diseases of upper respiratory tract: Secondary | ICD-10-CM | POA: Diagnosis not present

## 2017-04-07 DIAGNOSIS — D7582 Heparin induced thrombocytopenia (HIT): Secondary | ICD-10-CM | POA: Diagnosis not present

## 2017-04-07 DIAGNOSIS — D709 Neutropenia, unspecified: Secondary | ICD-10-CM | POA: Diagnosis not present

## 2017-04-07 DIAGNOSIS — Z7901 Long term (current) use of anticoagulants: Secondary | ICD-10-CM | POA: Diagnosis not present

## 2017-04-07 DIAGNOSIS — R197 Diarrhea, unspecified: Secondary | ICD-10-CM | POA: Diagnosis not present

## 2017-04-07 DIAGNOSIS — E872 Acidosis: Secondary | ICD-10-CM | POA: Diagnosis not present

## 2017-04-07 DIAGNOSIS — Z9049 Acquired absence of other specified parts of digestive tract: Secondary | ICD-10-CM | POA: Diagnosis not present

## 2017-04-07 DIAGNOSIS — T8131XD Disruption of external operation (surgical) wound, not elsewhere classified, subsequent encounter: Secondary | ICD-10-CM | POA: Diagnosis not present

## 2017-04-07 DIAGNOSIS — R627 Adult failure to thrive: Secondary | ICD-10-CM | POA: Diagnosis not present

## 2017-04-07 DIAGNOSIS — Z8701 Personal history of pneumonia (recurrent): Secondary | ICD-10-CM | POA: Diagnosis not present

## 2017-04-07 DIAGNOSIS — R140 Abdominal distension (gaseous): Secondary | ICD-10-CM | POA: Diagnosis not present

## 2017-04-07 DIAGNOSIS — C8331 Diffuse large B-cell lymphoma, lymph nodes of head, face, and neck: Secondary | ICD-10-CM | POA: Diagnosis not present

## 2017-04-07 DIAGNOSIS — J9691 Respiratory failure, unspecified with hypoxia: Secondary | ICD-10-CM | POA: Diagnosis not present

## 2017-04-07 DIAGNOSIS — I779 Disorder of arteries and arterioles, unspecified: Secondary | ICD-10-CM | POA: Diagnosis not present

## 2017-04-07 DIAGNOSIS — R531 Weakness: Secondary | ICD-10-CM | POA: Diagnosis not present

## 2017-04-07 DIAGNOSIS — E440 Moderate protein-calorie malnutrition: Secondary | ICD-10-CM | POA: Diagnosis not present

## 2017-04-07 DIAGNOSIS — R911 Solitary pulmonary nodule: Secondary | ICD-10-CM | POA: Diagnosis not present

## 2017-04-07 DIAGNOSIS — N10 Acute tubulo-interstitial nephritis: Secondary | ICD-10-CM | POA: Diagnosis not present

## 2017-04-07 DIAGNOSIS — R6521 Severe sepsis with septic shock: Secondary | ICD-10-CM | POA: Diagnosis not present

## 2017-04-07 DIAGNOSIS — I959 Hypotension, unspecified: Secondary | ICD-10-CM | POA: Diagnosis not present

## 2017-04-07 DIAGNOSIS — D696 Thrombocytopenia, unspecified: Secondary | ICD-10-CM | POA: Diagnosis not present

## 2017-04-07 DIAGNOSIS — E873 Alkalosis: Secondary | ICD-10-CM | POA: Diagnosis not present

## 2017-04-07 DIAGNOSIS — E860 Dehydration: Secondary | ICD-10-CM | POA: Diagnosis not present

## 2017-04-07 DIAGNOSIS — E039 Hypothyroidism, unspecified: Secondary | ICD-10-CM | POA: Diagnosis not present

## 2017-04-07 DIAGNOSIS — C8338 Diffuse large B-cell lymphoma, lymph nodes of multiple sites: Secondary | ICD-10-CM | POA: Diagnosis not present

## 2017-04-07 DIAGNOSIS — Z931 Gastrostomy status: Secondary | ICD-10-CM | POA: Diagnosis not present

## 2017-04-07 DIAGNOSIS — K5090 Crohn's disease, unspecified, without complications: Secondary | ICD-10-CM | POA: Diagnosis not present

## 2017-04-07 DIAGNOSIS — R579 Shock, unspecified: Secondary | ICD-10-CM | POA: Diagnosis not present

## 2017-04-07 DIAGNOSIS — C8330 Diffuse large B-cell lymphoma, unspecified site: Secondary | ICD-10-CM | POA: Diagnosis not present

## 2017-04-07 DIAGNOSIS — K632 Fistula of intestine: Secondary | ICD-10-CM | POA: Diagnosis not present

## 2017-04-07 DIAGNOSIS — J439 Emphysema, unspecified: Secondary | ICD-10-CM | POA: Diagnosis not present

## 2017-04-07 DIAGNOSIS — E43 Unspecified severe protein-calorie malnutrition: Secondary | ICD-10-CM | POA: Diagnosis not present

## 2017-04-07 DIAGNOSIS — A419 Sepsis, unspecified organism: Secondary | ICD-10-CM | POA: Diagnosis not present

## 2017-04-07 DIAGNOSIS — N2889 Other specified disorders of kidney and ureter: Secondary | ICD-10-CM | POA: Diagnosis not present

## 2017-04-07 DIAGNOSIS — N390 Urinary tract infection, site not specified: Secondary | ICD-10-CM | POA: Diagnosis not present

## 2017-04-07 DIAGNOSIS — D61818 Other pancytopenia: Secondary | ICD-10-CM | POA: Diagnosis not present

## 2017-04-07 DIAGNOSIS — R112 Nausea with vomiting, unspecified: Secondary | ICD-10-CM | POA: Diagnosis not present

## 2017-04-07 DIAGNOSIS — R634 Abnormal weight loss: Secondary | ICD-10-CM | POA: Diagnosis not present

## 2017-04-07 DIAGNOSIS — N12 Tubulo-interstitial nephritis, not specified as acute or chronic: Secondary | ICD-10-CM | POA: Diagnosis not present

## 2017-04-07 DIAGNOSIS — R42 Dizziness and giddiness: Secondary | ICD-10-CM | POA: Diagnosis not present

## 2017-04-07 DIAGNOSIS — R079 Chest pain, unspecified: Secondary | ICD-10-CM | POA: Diagnosis not present

## 2017-04-07 DIAGNOSIS — R918 Other nonspecific abnormal finding of lung field: Secondary | ICD-10-CM | POA: Diagnosis not present

## 2017-04-17 DIAGNOSIS — C8331 Diffuse large B-cell lymphoma, lymph nodes of head, face, and neck: Secondary | ICD-10-CM | POA: Diagnosis not present

## 2017-04-17 DIAGNOSIS — N12 Tubulo-interstitial nephritis, not specified as acute or chronic: Secondary | ICD-10-CM | POA: Diagnosis not present

## 2017-04-17 DIAGNOSIS — E039 Hypothyroidism, unspecified: Secondary | ICD-10-CM | POA: Diagnosis not present

## 2017-04-17 DIAGNOSIS — R600 Localized edema: Secondary | ICD-10-CM | POA: Diagnosis not present

## 2017-04-17 DIAGNOSIS — R300 Dysuria: Secondary | ICD-10-CM | POA: Diagnosis not present

## 2017-04-17 DIAGNOSIS — R627 Adult failure to thrive: Secondary | ICD-10-CM | POA: Diagnosis not present

## 2017-04-17 DIAGNOSIS — R5383 Other fatigue: Secondary | ICD-10-CM | POA: Diagnosis not present

## 2017-04-17 DIAGNOSIS — J90 Pleural effusion, not elsewhere classified: Secondary | ICD-10-CM | POA: Diagnosis not present

## 2017-04-17 DIAGNOSIS — R918 Other nonspecific abnormal finding of lung field: Secondary | ICD-10-CM | POA: Diagnosis not present

## 2017-04-17 DIAGNOSIS — C8330 Diffuse large B-cell lymphoma, unspecified site: Secondary | ICD-10-CM | POA: Diagnosis not present

## 2017-04-17 DIAGNOSIS — E8809 Other disorders of plasma-protein metabolism, not elsewhere classified: Secondary | ICD-10-CM | POA: Diagnosis not present

## 2017-04-17 DIAGNOSIS — R001 Bradycardia, unspecified: Secondary | ICD-10-CM | POA: Diagnosis not present

## 2017-04-17 DIAGNOSIS — I959 Hypotension, unspecified: Secondary | ICD-10-CM | POA: Diagnosis not present

## 2017-04-17 DIAGNOSIS — R609 Edema, unspecified: Secondary | ICD-10-CM | POA: Diagnosis not present

## 2017-04-18 DIAGNOSIS — R5383 Other fatigue: Secondary | ICD-10-CM | POA: Diagnosis not present

## 2017-04-18 DIAGNOSIS — R001 Bradycardia, unspecified: Secondary | ICD-10-CM | POA: Diagnosis not present

## 2017-04-18 DIAGNOSIS — R600 Localized edema: Secondary | ICD-10-CM | POA: Diagnosis not present

## 2017-04-18 DIAGNOSIS — I959 Hypotension, unspecified: Secondary | ICD-10-CM | POA: Diagnosis not present

## 2017-04-18 DIAGNOSIS — C8331 Diffuse large B-cell lymphoma, lymph nodes of head, face, and neck: Secondary | ICD-10-CM | POA: Diagnosis not present

## 2017-04-18 DIAGNOSIS — R300 Dysuria: Secondary | ICD-10-CM | POA: Diagnosis not present

## 2017-04-18 DIAGNOSIS — I82C21 Chronic embolism and thrombosis of right internal jugular vein: Secondary | ICD-10-CM | POA: Diagnosis not present

## 2017-04-18 DIAGNOSIS — E039 Hypothyroidism, unspecified: Secondary | ICD-10-CM | POA: Diagnosis not present

## 2017-04-18 DIAGNOSIS — J90 Pleural effusion, not elsewhere classified: Secondary | ICD-10-CM | POA: Diagnosis not present

## 2017-04-18 DIAGNOSIS — R627 Adult failure to thrive: Secondary | ICD-10-CM | POA: Diagnosis not present

## 2017-04-19 DIAGNOSIS — E43 Unspecified severe protein-calorie malnutrition: Secondary | ICD-10-CM | POA: Diagnosis not present

## 2017-04-19 DIAGNOSIS — Z8701 Personal history of pneumonia (recurrent): Secondary | ICD-10-CM | POA: Diagnosis not present

## 2017-04-19 DIAGNOSIS — D709 Neutropenia, unspecified: Secondary | ICD-10-CM | POA: Diagnosis not present

## 2017-04-19 DIAGNOSIS — K632 Fistula of intestine: Secondary | ICD-10-CM | POA: Diagnosis not present

## 2017-04-19 DIAGNOSIS — C8338 Diffuse large B-cell lymphoma, lymph nodes of multiple sites: Secondary | ICD-10-CM | POA: Diagnosis not present

## 2017-04-19 DIAGNOSIS — T8131XD Disruption of external operation (surgical) wound, not elsewhere classified, subsequent encounter: Secondary | ICD-10-CM | POA: Diagnosis not present

## 2017-04-22 DIAGNOSIS — T8131XD Disruption of external operation (surgical) wound, not elsewhere classified, subsequent encounter: Secondary | ICD-10-CM | POA: Diagnosis not present

## 2017-04-22 DIAGNOSIS — Z8701 Personal history of pneumonia (recurrent): Secondary | ICD-10-CM | POA: Diagnosis not present

## 2017-04-22 DIAGNOSIS — E43 Unspecified severe protein-calorie malnutrition: Secondary | ICD-10-CM | POA: Diagnosis not present

## 2017-04-22 DIAGNOSIS — D61818 Other pancytopenia: Secondary | ICD-10-CM | POA: Diagnosis not present

## 2017-04-22 DIAGNOSIS — E440 Moderate protein-calorie malnutrition: Secondary | ICD-10-CM | POA: Diagnosis not present

## 2017-04-22 DIAGNOSIS — D709 Neutropenia, unspecified: Secondary | ICD-10-CM | POA: Diagnosis not present

## 2017-04-22 DIAGNOSIS — C8338 Diffuse large B-cell lymphoma, lymph nodes of multiple sites: Secondary | ICD-10-CM | POA: Diagnosis not present

## 2017-04-22 DIAGNOSIS — K632 Fistula of intestine: Secondary | ICD-10-CM | POA: Diagnosis not present

## 2017-04-24 DIAGNOSIS — T8131XD Disruption of external operation (surgical) wound, not elsewhere classified, subsequent encounter: Secondary | ICD-10-CM | POA: Diagnosis not present

## 2017-04-24 DIAGNOSIS — E8809 Other disorders of plasma-protein metabolism, not elsewhere classified: Secondary | ICD-10-CM | POA: Diagnosis not present

## 2017-04-24 DIAGNOSIS — Z4682 Encounter for fitting and adjustment of non-vascular catheter: Secondary | ICD-10-CM | POA: Diagnosis not present

## 2017-04-24 DIAGNOSIS — C8338 Diffuse large B-cell lymphoma, lymph nodes of multiple sites: Secondary | ICD-10-CM | POA: Diagnosis not present

## 2017-04-24 DIAGNOSIS — Z79899 Other long term (current) drug therapy: Secondary | ICD-10-CM | POA: Diagnosis not present

## 2017-04-24 DIAGNOSIS — E43 Unspecified severe protein-calorie malnutrition: Secondary | ICD-10-CM | POA: Diagnosis not present

## 2017-04-24 DIAGNOSIS — Z431 Encounter for attention to gastrostomy: Secondary | ICD-10-CM | POA: Diagnosis not present

## 2017-04-24 DIAGNOSIS — Z681 Body mass index (BMI) 19 or less, adult: Secondary | ICD-10-CM | POA: Diagnosis not present

## 2017-04-24 DIAGNOSIS — D709 Neutropenia, unspecified: Secondary | ICD-10-CM | POA: Diagnosis not present

## 2017-04-24 DIAGNOSIS — R300 Dysuria: Secondary | ICD-10-CM | POA: Diagnosis not present

## 2017-04-24 DIAGNOSIS — R2233 Localized swelling, mass and lump, upper limb, bilateral: Secondary | ICD-10-CM | POA: Diagnosis not present

## 2017-04-24 DIAGNOSIS — K632 Fistula of intestine: Secondary | ICD-10-CM | POA: Diagnosis not present

## 2017-04-24 DIAGNOSIS — R627 Adult failure to thrive: Secondary | ICD-10-CM | POA: Diagnosis not present

## 2017-04-24 DIAGNOSIS — I959 Hypotension, unspecified: Secondary | ICD-10-CM | POA: Diagnosis not present

## 2017-04-24 DIAGNOSIS — Z8701 Personal history of pneumonia (recurrent): Secondary | ICD-10-CM | POA: Diagnosis not present

## 2017-04-24 DIAGNOSIS — N12 Tubulo-interstitial nephritis, not specified as acute or chronic: Secondary | ICD-10-CM | POA: Diagnosis not present

## 2017-04-24 DIAGNOSIS — K9423 Gastrostomy malfunction: Secondary | ICD-10-CM | POA: Diagnosis not present

## 2017-04-28 DIAGNOSIS — D709 Neutropenia, unspecified: Secondary | ICD-10-CM | POA: Diagnosis not present

## 2017-04-28 DIAGNOSIS — E440 Moderate protein-calorie malnutrition: Secondary | ICD-10-CM | POA: Diagnosis not present

## 2017-04-28 DIAGNOSIS — R634 Abnormal weight loss: Secondary | ICD-10-CM | POA: Diagnosis not present

## 2017-04-28 DIAGNOSIS — C8338 Diffuse large B-cell lymphoma, lymph nodes of multiple sites: Secondary | ICD-10-CM | POA: Diagnosis not present

## 2017-04-28 DIAGNOSIS — E119 Type 2 diabetes mellitus without complications: Secondary | ICD-10-CM | POA: Diagnosis not present

## 2017-04-28 DIAGNOSIS — C8331 Diffuse large B-cell lymphoma, lymph nodes of head, face, and neck: Secondary | ICD-10-CM | POA: Diagnosis not present

## 2017-04-28 DIAGNOSIS — K632 Fistula of intestine: Secondary | ICD-10-CM | POA: Diagnosis not present

## 2017-04-28 DIAGNOSIS — S61412D Laceration without foreign body of left hand, subsequent encounter: Secondary | ICD-10-CM | POA: Diagnosis not present

## 2017-04-29 DIAGNOSIS — S61412D Laceration without foreign body of left hand, subsequent encounter: Secondary | ICD-10-CM | POA: Diagnosis not present

## 2017-04-29 DIAGNOSIS — D709 Neutropenia, unspecified: Secondary | ICD-10-CM | POA: Diagnosis not present

## 2017-04-29 DIAGNOSIS — E440 Moderate protein-calorie malnutrition: Secondary | ICD-10-CM | POA: Diagnosis not present

## 2017-04-29 DIAGNOSIS — K632 Fistula of intestine: Secondary | ICD-10-CM | POA: Diagnosis not present

## 2017-04-29 DIAGNOSIS — C8338 Diffuse large B-cell lymphoma, lymph nodes of multiple sites: Secondary | ICD-10-CM | POA: Diagnosis not present

## 2017-04-29 DIAGNOSIS — E119 Type 2 diabetes mellitus without complications: Secondary | ICD-10-CM | POA: Diagnosis not present

## 2017-05-01 DIAGNOSIS — E119 Type 2 diabetes mellitus without complications: Secondary | ICD-10-CM | POA: Diagnosis not present

## 2017-05-01 DIAGNOSIS — S61412D Laceration without foreign body of left hand, subsequent encounter: Secondary | ICD-10-CM | POA: Diagnosis not present

## 2017-05-01 DIAGNOSIS — D709 Neutropenia, unspecified: Secondary | ICD-10-CM | POA: Diagnosis not present

## 2017-05-01 DIAGNOSIS — C8338 Diffuse large B-cell lymphoma, lymph nodes of multiple sites: Secondary | ICD-10-CM | POA: Diagnosis not present

## 2017-05-01 DIAGNOSIS — E440 Moderate protein-calorie malnutrition: Secondary | ICD-10-CM | POA: Diagnosis not present

## 2017-05-01 DIAGNOSIS — K632 Fistula of intestine: Secondary | ICD-10-CM | POA: Diagnosis not present

## 2017-05-05 DIAGNOSIS — D709 Neutropenia, unspecified: Secondary | ICD-10-CM | POA: Diagnosis not present

## 2017-05-05 DIAGNOSIS — R109 Unspecified abdominal pain: Secondary | ICD-10-CM | POA: Diagnosis not present

## 2017-05-05 DIAGNOSIS — C8331 Diffuse large B-cell lymphoma, lymph nodes of head, face, and neck: Secondary | ICD-10-CM | POA: Diagnosis not present

## 2017-05-05 DIAGNOSIS — E049 Nontoxic goiter, unspecified: Secondary | ICD-10-CM | POA: Diagnosis not present

## 2017-05-05 DIAGNOSIS — K631 Perforation of intestine (nontraumatic): Secondary | ICD-10-CM | POA: Diagnosis not present

## 2017-05-05 DIAGNOSIS — E119 Type 2 diabetes mellitus without complications: Secondary | ICD-10-CM | POA: Diagnosis not present

## 2017-05-05 DIAGNOSIS — S61412D Laceration without foreign body of left hand, subsequent encounter: Secondary | ICD-10-CM | POA: Diagnosis not present

## 2017-05-05 DIAGNOSIS — E440 Moderate protein-calorie malnutrition: Secondary | ICD-10-CM | POA: Diagnosis not present

## 2017-05-05 DIAGNOSIS — E876 Hypokalemia: Secondary | ICD-10-CM | POA: Diagnosis not present

## 2017-05-05 DIAGNOSIS — K632 Fistula of intestine: Secondary | ICD-10-CM | POA: Diagnosis not present

## 2017-05-05 DIAGNOSIS — C8338 Diffuse large B-cell lymphoma, lymph nodes of multiple sites: Secondary | ICD-10-CM | POA: Diagnosis not present

## 2017-05-06 DIAGNOSIS — Z930 Tracheostomy status: Secondary | ICD-10-CM | POA: Diagnosis not present

## 2017-05-06 DIAGNOSIS — E079 Disorder of thyroid, unspecified: Secondary | ICD-10-CM | POA: Diagnosis not present

## 2017-05-06 DIAGNOSIS — S61412D Laceration without foreign body of left hand, subsequent encounter: Secondary | ICD-10-CM | POA: Diagnosis not present

## 2017-05-06 DIAGNOSIS — J398 Other specified diseases of upper respiratory tract: Secondary | ICD-10-CM | POA: Diagnosis not present

## 2017-05-06 DIAGNOSIS — E119 Type 2 diabetes mellitus without complications: Secondary | ICD-10-CM | POA: Diagnosis not present

## 2017-05-06 DIAGNOSIS — E440 Moderate protein-calorie malnutrition: Secondary | ICD-10-CM | POA: Diagnosis not present

## 2017-05-06 DIAGNOSIS — C8331 Diffuse large B-cell lymphoma, lymph nodes of head, face, and neck: Secondary | ICD-10-CM | POA: Diagnosis not present

## 2017-05-06 DIAGNOSIS — C8338 Diffuse large B-cell lymphoma, lymph nodes of multiple sites: Secondary | ICD-10-CM | POA: Diagnosis not present

## 2017-05-06 DIAGNOSIS — K632 Fistula of intestine: Secondary | ICD-10-CM | POA: Diagnosis not present

## 2017-05-06 DIAGNOSIS — D709 Neutropenia, unspecified: Secondary | ICD-10-CM | POA: Diagnosis not present

## 2017-05-06 DIAGNOSIS — E049 Nontoxic goiter, unspecified: Secondary | ICD-10-CM | POA: Diagnosis not present

## 2017-05-08 DIAGNOSIS — C8338 Diffuse large B-cell lymphoma, lymph nodes of multiple sites: Secondary | ICD-10-CM | POA: Diagnosis not present

## 2017-05-08 DIAGNOSIS — S61412D Laceration without foreign body of left hand, subsequent encounter: Secondary | ICD-10-CM | POA: Diagnosis not present

## 2017-05-08 DIAGNOSIS — D709 Neutropenia, unspecified: Secondary | ICD-10-CM | POA: Diagnosis not present

## 2017-05-08 DIAGNOSIS — E440 Moderate protein-calorie malnutrition: Secondary | ICD-10-CM | POA: Diagnosis not present

## 2017-05-08 DIAGNOSIS — E119 Type 2 diabetes mellitus without complications: Secondary | ICD-10-CM | POA: Diagnosis not present

## 2017-05-08 DIAGNOSIS — K632 Fistula of intestine: Secondary | ICD-10-CM | POA: Diagnosis not present

## 2017-05-12 DIAGNOSIS — S61412D Laceration without foreign body of left hand, subsequent encounter: Secondary | ICD-10-CM | POA: Diagnosis not present

## 2017-05-12 DIAGNOSIS — E440 Moderate protein-calorie malnutrition: Secondary | ICD-10-CM | POA: Diagnosis not present

## 2017-05-12 DIAGNOSIS — N401 Enlarged prostate with lower urinary tract symptoms: Secondary | ICD-10-CM | POA: Diagnosis not present

## 2017-05-12 DIAGNOSIS — N3289 Other specified disorders of bladder: Secondary | ICD-10-CM | POA: Diagnosis not present

## 2017-05-12 DIAGNOSIS — R351 Nocturia: Secondary | ICD-10-CM | POA: Diagnosis not present

## 2017-05-12 DIAGNOSIS — K632 Fistula of intestine: Secondary | ICD-10-CM | POA: Diagnosis not present

## 2017-05-12 DIAGNOSIS — C8338 Diffuse large B-cell lymphoma, lymph nodes of multiple sites: Secondary | ICD-10-CM | POA: Diagnosis not present

## 2017-05-12 DIAGNOSIS — N390 Urinary tract infection, site not specified: Secondary | ICD-10-CM | POA: Diagnosis not present

## 2017-05-12 DIAGNOSIS — E119 Type 2 diabetes mellitus without complications: Secondary | ICD-10-CM | POA: Diagnosis not present

## 2017-05-12 DIAGNOSIS — D709 Neutropenia, unspecified: Secondary | ICD-10-CM | POA: Diagnosis not present

## 2017-05-14 DIAGNOSIS — E119 Type 2 diabetes mellitus without complications: Secondary | ICD-10-CM | POA: Diagnosis not present

## 2017-05-14 DIAGNOSIS — K632 Fistula of intestine: Secondary | ICD-10-CM | POA: Diagnosis not present

## 2017-05-14 DIAGNOSIS — D709 Neutropenia, unspecified: Secondary | ICD-10-CM | POA: Diagnosis not present

## 2017-05-14 DIAGNOSIS — E440 Moderate protein-calorie malnutrition: Secondary | ICD-10-CM | POA: Diagnosis not present

## 2017-05-14 DIAGNOSIS — S61412D Laceration without foreign body of left hand, subsequent encounter: Secondary | ICD-10-CM | POA: Diagnosis not present

## 2017-05-14 DIAGNOSIS — C8338 Diffuse large B-cell lymphoma, lymph nodes of multiple sites: Secondary | ICD-10-CM | POA: Diagnosis not present

## 2017-05-19 DIAGNOSIS — C8331 Diffuse large B-cell lymphoma, lymph nodes of head, face, and neck: Secondary | ICD-10-CM | POA: Diagnosis not present

## 2017-05-22 DIAGNOSIS — E440 Moderate protein-calorie malnutrition: Secondary | ICD-10-CM | POA: Diagnosis not present

## 2017-05-22 DIAGNOSIS — K632 Fistula of intestine: Secondary | ICD-10-CM | POA: Diagnosis not present

## 2017-05-22 DIAGNOSIS — S61412D Laceration without foreign body of left hand, subsequent encounter: Secondary | ICD-10-CM | POA: Diagnosis not present

## 2017-05-22 DIAGNOSIS — C8338 Diffuse large B-cell lymphoma, lymph nodes of multiple sites: Secondary | ICD-10-CM | POA: Diagnosis not present

## 2017-05-22 DIAGNOSIS — E119 Type 2 diabetes mellitus without complications: Secondary | ICD-10-CM | POA: Diagnosis not present

## 2017-05-22 DIAGNOSIS — D709 Neutropenia, unspecified: Secondary | ICD-10-CM | POA: Diagnosis not present

## 2017-05-23 DIAGNOSIS — E440 Moderate protein-calorie malnutrition: Secondary | ICD-10-CM | POA: Diagnosis not present

## 2017-05-23 DIAGNOSIS — C8338 Diffuse large B-cell lymphoma, lymph nodes of multiple sites: Secondary | ICD-10-CM | POA: Diagnosis not present

## 2017-05-23 DIAGNOSIS — D709 Neutropenia, unspecified: Secondary | ICD-10-CM | POA: Diagnosis not present

## 2017-05-23 DIAGNOSIS — K632 Fistula of intestine: Secondary | ICD-10-CM | POA: Diagnosis not present

## 2017-05-23 DIAGNOSIS — E119 Type 2 diabetes mellitus without complications: Secondary | ICD-10-CM | POA: Diagnosis not present

## 2017-05-23 DIAGNOSIS — S61412D Laceration without foreign body of left hand, subsequent encounter: Secondary | ICD-10-CM | POA: Diagnosis not present

## 2017-05-26 DIAGNOSIS — C8331 Diffuse large B-cell lymphoma, lymph nodes of head, face, and neck: Secondary | ICD-10-CM | POA: Diagnosis not present

## 2017-05-28 DIAGNOSIS — C8338 Diffuse large B-cell lymphoma, lymph nodes of multiple sites: Secondary | ICD-10-CM | POA: Diagnosis not present

## 2017-05-28 DIAGNOSIS — K632 Fistula of intestine: Secondary | ICD-10-CM | POA: Diagnosis not present

## 2017-05-28 DIAGNOSIS — D709 Neutropenia, unspecified: Secondary | ICD-10-CM | POA: Diagnosis not present

## 2017-05-28 DIAGNOSIS — E119 Type 2 diabetes mellitus without complications: Secondary | ICD-10-CM | POA: Diagnosis not present

## 2017-05-28 DIAGNOSIS — S61412D Laceration without foreign body of left hand, subsequent encounter: Secondary | ICD-10-CM | POA: Diagnosis not present

## 2017-05-28 DIAGNOSIS — E440 Moderate protein-calorie malnutrition: Secondary | ICD-10-CM | POA: Diagnosis not present

## 2017-05-29 DIAGNOSIS — E119 Type 2 diabetes mellitus without complications: Secondary | ICD-10-CM | POA: Diagnosis not present

## 2017-05-29 DIAGNOSIS — D709 Neutropenia, unspecified: Secondary | ICD-10-CM | POA: Diagnosis not present

## 2017-05-29 DIAGNOSIS — K632 Fistula of intestine: Secondary | ICD-10-CM | POA: Diagnosis not present

## 2017-05-29 DIAGNOSIS — E440 Moderate protein-calorie malnutrition: Secondary | ICD-10-CM | POA: Diagnosis not present

## 2017-05-29 DIAGNOSIS — S61412D Laceration without foreign body of left hand, subsequent encounter: Secondary | ICD-10-CM | POA: Diagnosis not present

## 2017-05-29 DIAGNOSIS — C8338 Diffuse large B-cell lymphoma, lymph nodes of multiple sites: Secondary | ICD-10-CM | POA: Diagnosis not present

## 2017-06-02 DIAGNOSIS — C8331 Diffuse large B-cell lymphoma, lymph nodes of head, face, and neck: Secondary | ICD-10-CM | POA: Diagnosis not present

## 2017-06-04 DIAGNOSIS — K632 Fistula of intestine: Secondary | ICD-10-CM | POA: Diagnosis not present

## 2017-06-04 DIAGNOSIS — D709 Neutropenia, unspecified: Secondary | ICD-10-CM | POA: Diagnosis not present

## 2017-06-04 DIAGNOSIS — C8338 Diffuse large B-cell lymphoma, lymph nodes of multiple sites: Secondary | ICD-10-CM | POA: Diagnosis not present

## 2017-06-04 DIAGNOSIS — S61412D Laceration without foreign body of left hand, subsequent encounter: Secondary | ICD-10-CM | POA: Diagnosis not present

## 2017-06-04 DIAGNOSIS — E440 Moderate protein-calorie malnutrition: Secondary | ICD-10-CM | POA: Diagnosis not present

## 2017-06-04 DIAGNOSIS — E119 Type 2 diabetes mellitus without complications: Secondary | ICD-10-CM | POA: Diagnosis not present

## 2017-06-05 DIAGNOSIS — R109 Unspecified abdominal pain: Secondary | ICD-10-CM | POA: Diagnosis not present

## 2017-06-05 DIAGNOSIS — C8331 Diffuse large B-cell lymphoma, lymph nodes of head, face, and neck: Secondary | ICD-10-CM | POA: Diagnosis not present

## 2017-06-05 DIAGNOSIS — E049 Nontoxic goiter, unspecified: Secondary | ICD-10-CM | POA: Diagnosis not present

## 2017-06-05 DIAGNOSIS — K631 Perforation of intestine (nontraumatic): Secondary | ICD-10-CM | POA: Diagnosis not present

## 2017-06-06 DIAGNOSIS — E049 Nontoxic goiter, unspecified: Secondary | ICD-10-CM | POA: Diagnosis not present

## 2017-06-06 DIAGNOSIS — E079 Disorder of thyroid, unspecified: Secondary | ICD-10-CM | POA: Diagnosis not present

## 2017-06-06 DIAGNOSIS — C8331 Diffuse large B-cell lymphoma, lymph nodes of head, face, and neck: Secondary | ICD-10-CM | POA: Diagnosis not present

## 2017-06-06 DIAGNOSIS — Z930 Tracheostomy status: Secondary | ICD-10-CM | POA: Diagnosis not present

## 2017-06-06 DIAGNOSIS — J398 Other specified diseases of upper respiratory tract: Secondary | ICD-10-CM | POA: Diagnosis not present

## 2017-06-08 DIAGNOSIS — E039 Hypothyroidism, unspecified: Secondary | ICD-10-CM | POA: Diagnosis not present

## 2017-06-08 DIAGNOSIS — R109 Unspecified abdominal pain: Secondary | ICD-10-CM | POA: Diagnosis not present

## 2017-06-08 DIAGNOSIS — R4182 Altered mental status, unspecified: Secondary | ICD-10-CM | POA: Diagnosis not present

## 2017-06-08 DIAGNOSIS — R404 Transient alteration of awareness: Secondary | ICD-10-CM | POA: Diagnosis not present

## 2017-06-08 DIAGNOSIS — Z681 Body mass index (BMI) 19 or less, adult: Secondary | ICD-10-CM | POA: Diagnosis not present

## 2017-06-08 DIAGNOSIS — D630 Anemia in neoplastic disease: Secondary | ICD-10-CM | POA: Diagnosis not present

## 2017-06-08 DIAGNOSIS — R112 Nausea with vomiting, unspecified: Secondary | ICD-10-CM | POA: Diagnosis not present

## 2017-06-08 DIAGNOSIS — D696 Thrombocytopenia, unspecified: Secondary | ICD-10-CM | POA: Diagnosis not present

## 2017-06-08 DIAGNOSIS — E43 Unspecified severe protein-calorie malnutrition: Secondary | ICD-10-CM | POA: Diagnosis not present

## 2017-06-08 DIAGNOSIS — J690 Pneumonitis due to inhalation of food and vomit: Secondary | ICD-10-CM | POA: Diagnosis not present

## 2017-06-08 DIAGNOSIS — R64 Cachexia: Secondary | ICD-10-CM | POA: Diagnosis not present

## 2017-06-08 DIAGNOSIS — R531 Weakness: Secondary | ICD-10-CM | POA: Diagnosis not present

## 2017-06-08 DIAGNOSIS — Z431 Encounter for attention to gastrostomy: Secondary | ICD-10-CM | POA: Diagnosis not present

## 2017-06-08 DIAGNOSIS — C8590 Non-Hodgkin lymphoma, unspecified, unspecified site: Secondary | ICD-10-CM | POA: Diagnosis not present

## 2017-06-08 DIAGNOSIS — N400 Enlarged prostate without lower urinary tract symptoms: Secondary | ICD-10-CM | POA: Diagnosis not present

## 2017-06-08 DIAGNOSIS — J189 Pneumonia, unspecified organism: Secondary | ICD-10-CM | POA: Diagnosis not present

## 2017-06-08 DIAGNOSIS — J181 Lobar pneumonia, unspecified organism: Secondary | ICD-10-CM | POA: Diagnosis not present

## 2017-06-08 DIAGNOSIS — E876 Hypokalemia: Secondary | ICD-10-CM | POA: Diagnosis not present

## 2017-06-08 DIAGNOSIS — D649 Anemia, unspecified: Secondary | ICD-10-CM | POA: Diagnosis not present

## 2017-06-11 DIAGNOSIS — J690 Pneumonitis due to inhalation of food and vomit: Secondary | ICD-10-CM | POA: Diagnosis not present

## 2017-06-11 DIAGNOSIS — E43 Unspecified severe protein-calorie malnutrition: Secondary | ICD-10-CM | POA: Diagnosis not present

## 2017-06-11 DIAGNOSIS — R531 Weakness: Secondary | ICD-10-CM | POA: Diagnosis not present

## 2017-06-11 DIAGNOSIS — C8590 Non-Hodgkin lymphoma, unspecified, unspecified site: Secondary | ICD-10-CM | POA: Diagnosis not present

## 2017-06-13 DIAGNOSIS — J9601 Acute respiratory failure with hypoxia: Secondary | ICD-10-CM | POA: Diagnosis not present

## 2017-06-13 DIAGNOSIS — C8590 Non-Hodgkin lymphoma, unspecified, unspecified site: Secondary | ICD-10-CM | POA: Diagnosis not present

## 2017-06-13 DIAGNOSIS — D696 Thrombocytopenia, unspecified: Secondary | ICD-10-CM | POA: Diagnosis not present

## 2017-06-13 DIAGNOSIS — Z431 Encounter for attention to gastrostomy: Secondary | ICD-10-CM | POA: Diagnosis not present

## 2017-06-13 DIAGNOSIS — R627 Adult failure to thrive: Secondary | ICD-10-CM | POA: Diagnosis not present

## 2017-06-13 DIAGNOSIS — R069 Unspecified abnormalities of breathing: Secondary | ICD-10-CM | POA: Diagnosis not present

## 2017-06-13 DIAGNOSIS — R0603 Acute respiratory distress: Secondary | ICD-10-CM | POA: Diagnosis not present

## 2017-06-13 DIAGNOSIS — E43 Unspecified severe protein-calorie malnutrition: Secondary | ICD-10-CM | POA: Diagnosis not present

## 2017-06-13 DIAGNOSIS — D649 Anemia, unspecified: Secondary | ICD-10-CM | POA: Diagnosis not present

## 2017-06-13 DIAGNOSIS — N400 Enlarged prostate without lower urinary tract symptoms: Secondary | ICD-10-CM | POA: Diagnosis not present

## 2017-06-13 DIAGNOSIS — J690 Pneumonitis due to inhalation of food and vomit: Secondary | ICD-10-CM | POA: Diagnosis not present

## 2017-06-13 DIAGNOSIS — R4182 Altered mental status, unspecified: Secondary | ICD-10-CM | POA: Diagnosis not present

## 2017-06-13 DIAGNOSIS — R531 Weakness: Secondary | ICD-10-CM | POA: Diagnosis not present

## 2017-06-13 DIAGNOSIS — R0600 Dyspnea, unspecified: Secondary | ICD-10-CM | POA: Diagnosis not present

## 2017-06-13 DIAGNOSIS — J189 Pneumonia, unspecified organism: Secondary | ICD-10-CM | POA: Diagnosis not present

## 2017-06-13 DIAGNOSIS — D630 Anemia in neoplastic disease: Secondary | ICD-10-CM | POA: Diagnosis not present

## 2017-06-13 DIAGNOSIS — E039 Hypothyroidism, unspecified: Secondary | ICD-10-CM | POA: Diagnosis not present

## 2017-06-13 DIAGNOSIS — E876 Hypokalemia: Secondary | ICD-10-CM | POA: Diagnosis not present

## 2017-06-16 DIAGNOSIS — J189 Pneumonia, unspecified organism: Secondary | ICD-10-CM | POA: Diagnosis not present

## 2017-06-16 DIAGNOSIS — J9601 Acute respiratory failure with hypoxia: Secondary | ICD-10-CM | POA: Diagnosis not present

## 2017-06-16 DIAGNOSIS — D649 Anemia, unspecified: Secondary | ICD-10-CM | POA: Diagnosis not present

## 2017-06-16 DIAGNOSIS — R627 Adult failure to thrive: Secondary | ICD-10-CM | POA: Diagnosis not present

## 2017-06-19 DIAGNOSIS — E43 Unspecified severe protein-calorie malnutrition: Secondary | ICD-10-CM | POA: Diagnosis not present

## 2017-06-19 DIAGNOSIS — N179 Acute kidney failure, unspecified: Secondary | ICD-10-CM | POA: Diagnosis not present

## 2017-06-19 DIAGNOSIS — J9602 Acute respiratory failure with hypercapnia: Secondary | ICD-10-CM | POA: Diagnosis not present

## 2017-06-19 DIAGNOSIS — R64 Cachexia: Secondary | ICD-10-CM | POA: Diagnosis not present

## 2017-06-19 DIAGNOSIS — J9601 Acute respiratory failure with hypoxia: Secondary | ICD-10-CM | POA: Diagnosis not present

## 2017-06-19 DIAGNOSIS — R0600 Dyspnea, unspecified: Secondary | ICD-10-CM | POA: Diagnosis not present

## 2017-06-19 DIAGNOSIS — R0602 Shortness of breath: Secondary | ICD-10-CM | POA: Diagnosis not present

## 2017-06-19 DIAGNOSIS — J189 Pneumonia, unspecified organism: Secondary | ICD-10-CM | POA: Diagnosis not present

## 2017-06-19 DIAGNOSIS — R6521 Severe sepsis with septic shock: Secondary | ICD-10-CM | POA: Diagnosis not present

## 2017-06-19 DIAGNOSIS — C8590 Non-Hodgkin lymphoma, unspecified, unspecified site: Secondary | ICD-10-CM | POA: Diagnosis not present

## 2017-06-19 DIAGNOSIS — A419 Sepsis, unspecified organism: Secondary | ICD-10-CM | POA: Diagnosis not present

## 2017-06-19 DIAGNOSIS — R0603 Acute respiratory distress: Secondary | ICD-10-CM | POA: Diagnosis not present

## 2017-06-20 DIAGNOSIS — J189 Pneumonia, unspecified organism: Secondary | ICD-10-CM | POA: Diagnosis not present

## 2017-06-20 DIAGNOSIS — R6521 Severe sepsis with septic shock: Secondary | ICD-10-CM | POA: Diagnosis not present

## 2017-06-20 DIAGNOSIS — E43 Unspecified severe protein-calorie malnutrition: Secondary | ICD-10-CM | POA: Diagnosis not present

## 2017-06-20 DIAGNOSIS — C8590 Non-Hodgkin lymphoma, unspecified, unspecified site: Secondary | ICD-10-CM | POA: Diagnosis not present

## 2017-06-20 DIAGNOSIS — R64 Cachexia: Secondary | ICD-10-CM | POA: Diagnosis not present

## 2017-06-20 DIAGNOSIS — N179 Acute kidney failure, unspecified: Secondary | ICD-10-CM | POA: Diagnosis not present

## 2017-06-20 DIAGNOSIS — A419 Sepsis, unspecified organism: Secondary | ICD-10-CM | POA: Diagnosis not present

## 2017-07-07 DIAGNOSIS — J398 Other specified diseases of upper respiratory tract: Secondary | ICD-10-CM | POA: Diagnosis not present

## 2017-07-07 DIAGNOSIS — C8331 Diffuse large B-cell lymphoma, lymph nodes of head, face, and neck: Secondary | ICD-10-CM | POA: Diagnosis not present

## 2017-07-07 DIAGNOSIS — E079 Disorder of thyroid, unspecified: Secondary | ICD-10-CM | POA: Diagnosis not present

## 2017-07-07 DIAGNOSIS — Z930 Tracheostomy status: Secondary | ICD-10-CM | POA: Diagnosis not present

## 2017-07-21 DEATH — deceased

## 2017-12-02 IMAGING — DX DG CHEST 2V
2 series · 2 of 2 positions shown · non-contrast
Comparison: Chest x-ray dated 02/11/2014 and CT scan of the neck
dated 08/08/2016

CLINICAL DATA: Shortness of breath. Right-sided neck mass most
likely thyroid in origin.

EXAM:
CHEST  2 VIEW

[chest pa]
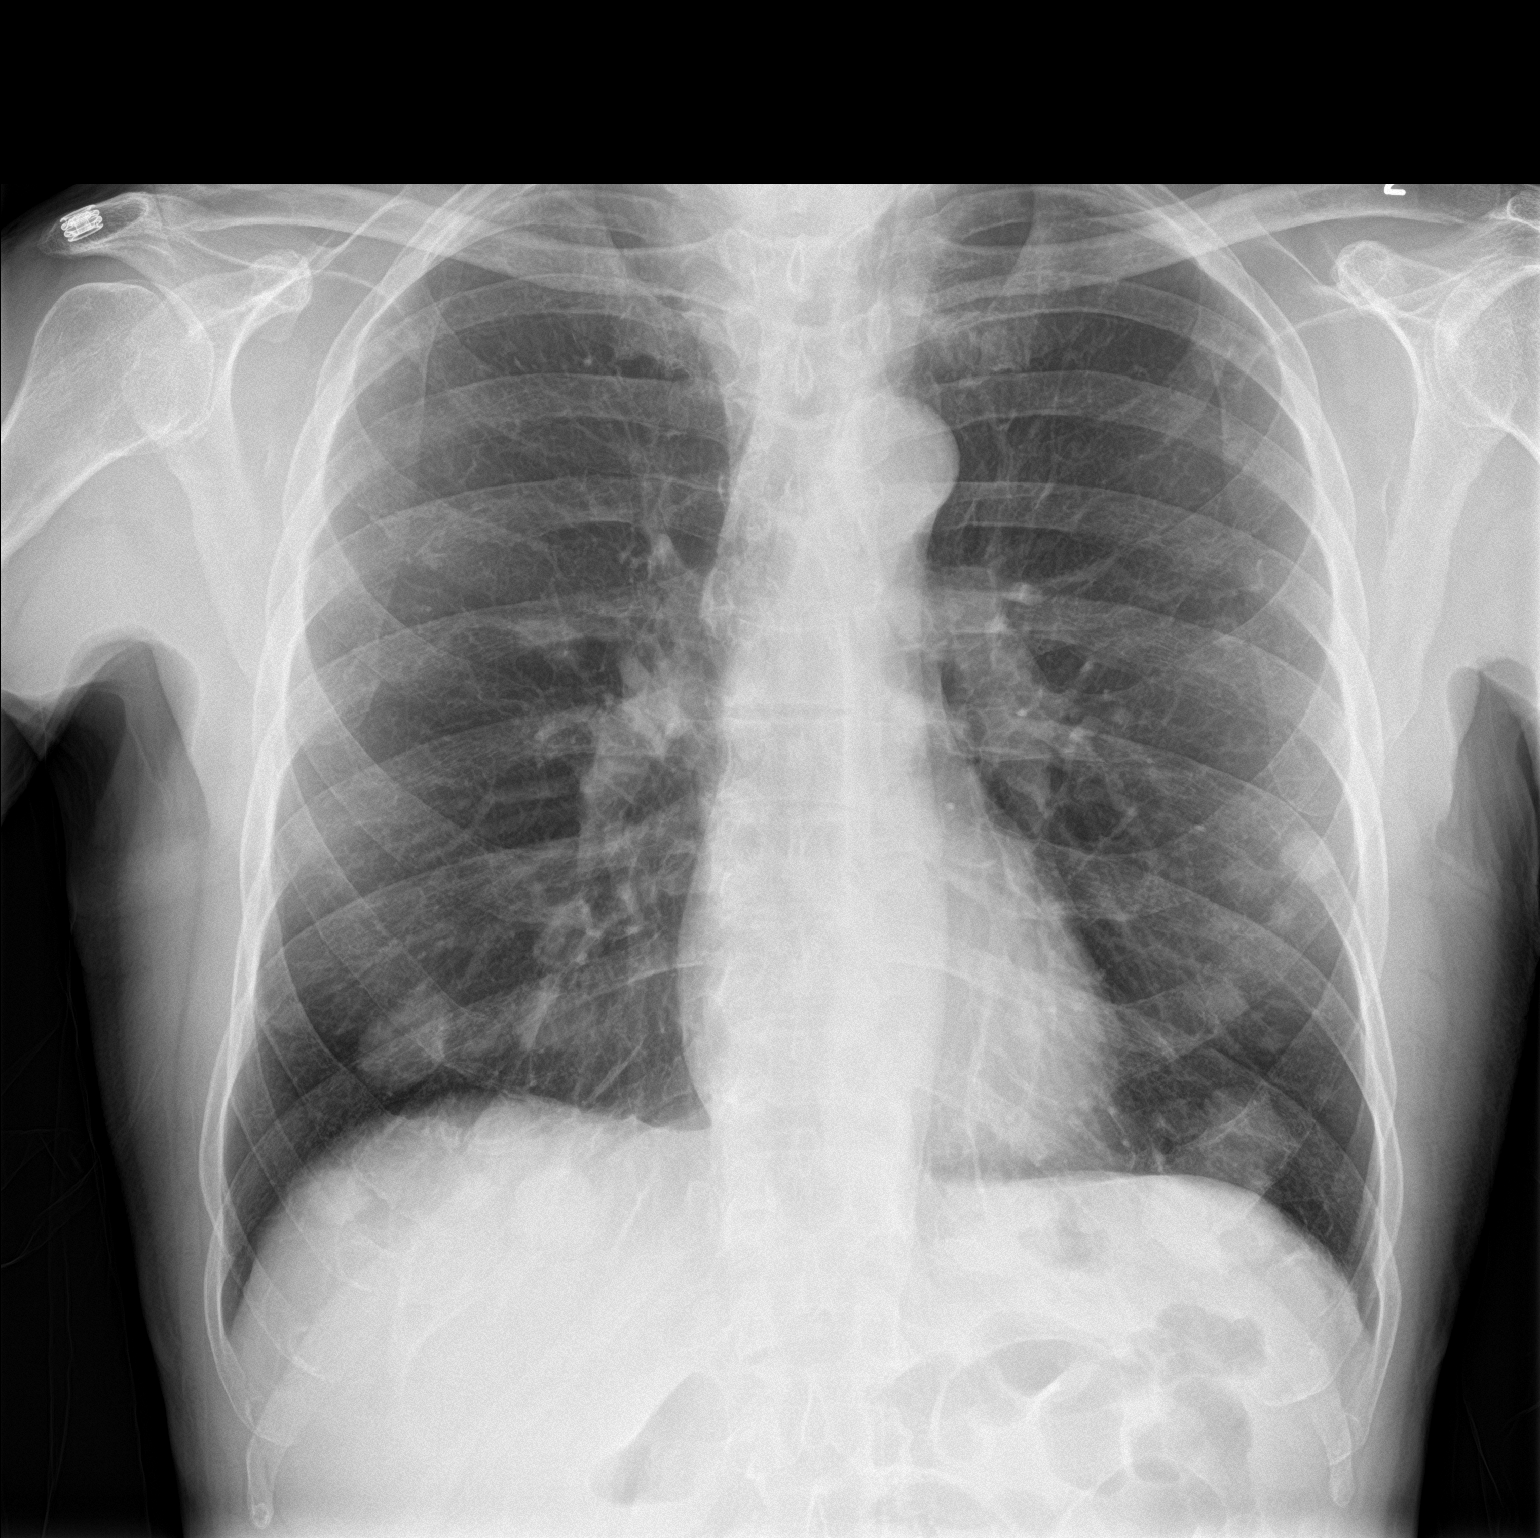

[chest lat]
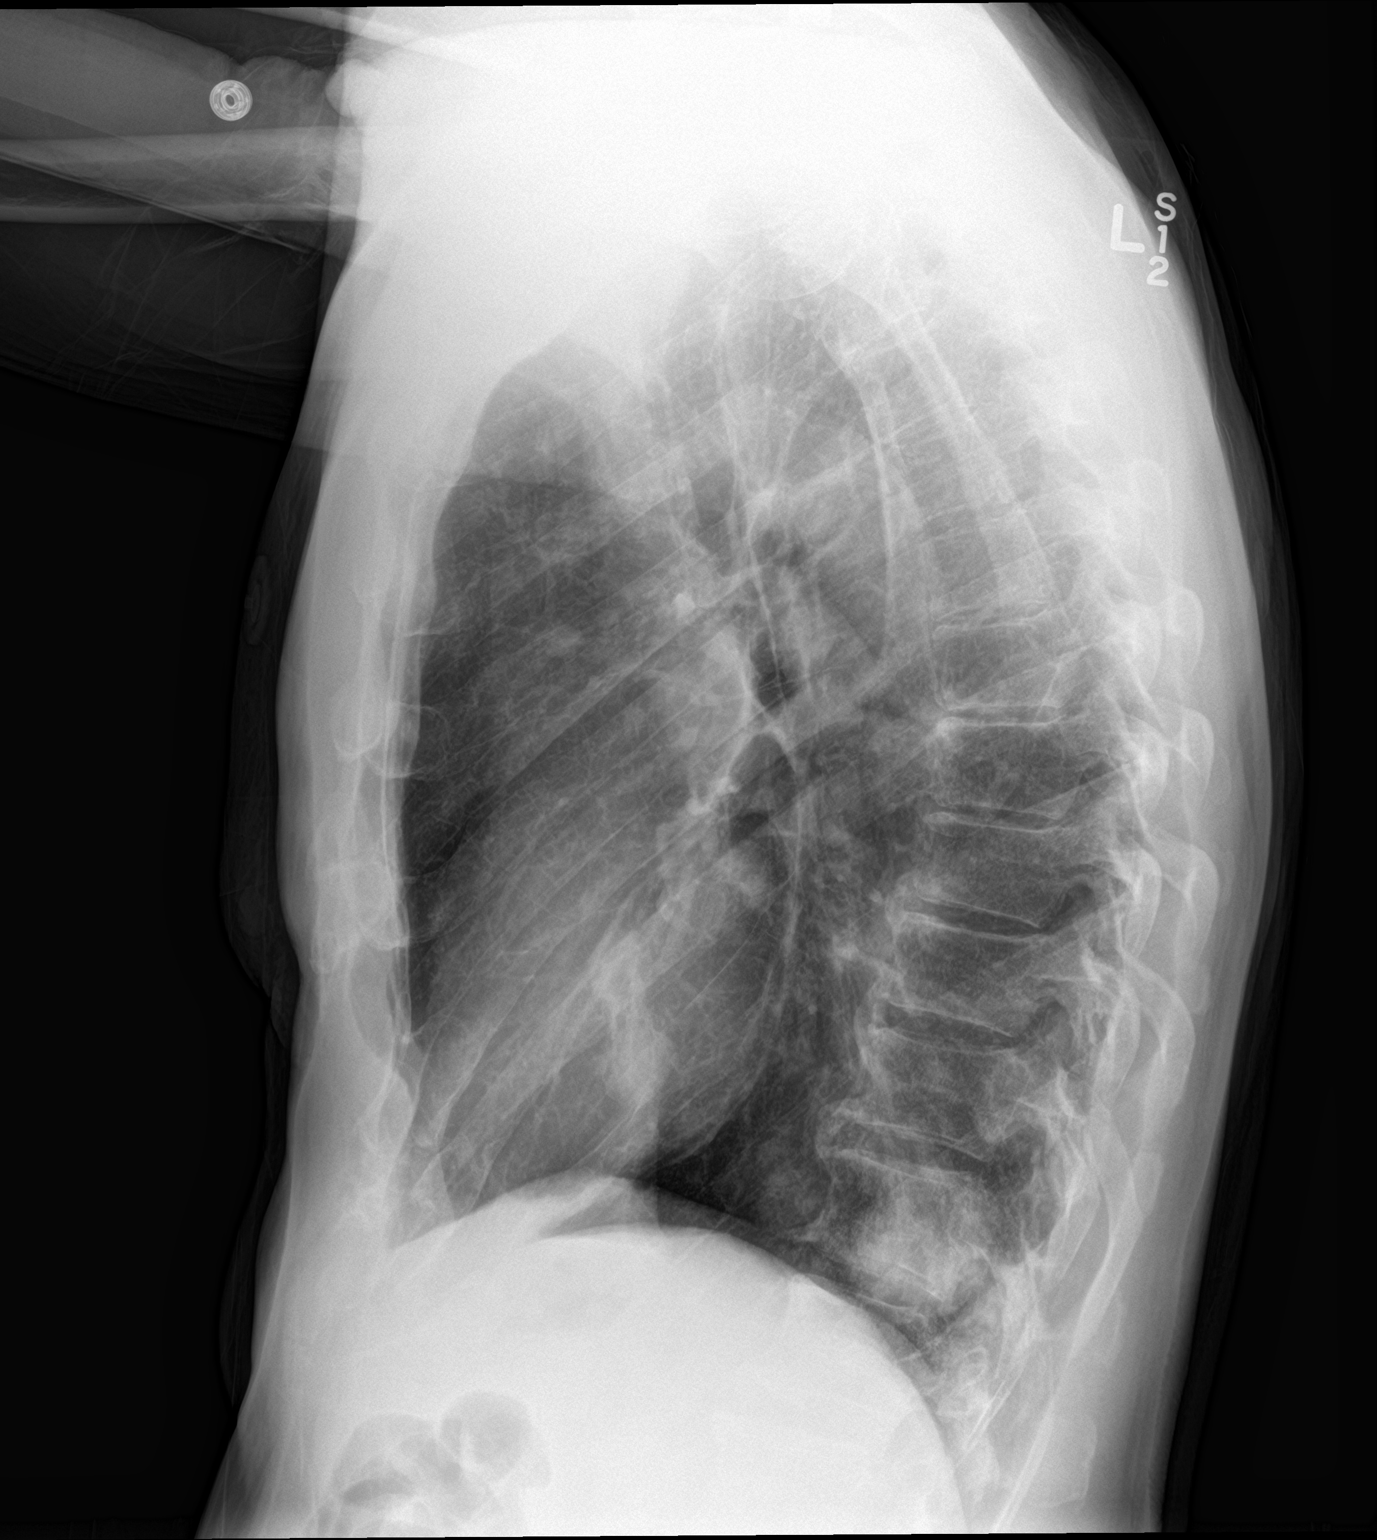

[2 of 2 positions shown; findings below may reference images not displayed]

FINDINGS: Patient has multiple metastatic nodules in both lungs, the largest
being approximately 2.5 cm at the left lung base and 2 nodules
measuring 2.8 and 2.6 cm at the right lung base.

The There is compression and deviation of the trachea to the left of
the trachea by the large right-sided neck mass just above the
thoracic inlet. This is probably the source of the patient's
shortness of breath.

Heart size and pulmonary vascularity are normal.

No effusions.  No discrete bone abnormality.
IMPRESSION: 1. Marked compression of the trachea just above the thoracic inlet
by the large right thyroid mass.
2. Multiple metastatic nodules in both lungs. I suspect this
represents metastatic thyroid cancer.
